# Patient Record
Sex: Female | Born: 1935 | Race: White | Hispanic: No | Smoking: Former smoker
Health system: Southern US, Community
[De-identification: ages and names within clinical notes are randomized; demographics above are authoritative.]

## PROBLEM LIST (undated history)

## (undated) DIAGNOSIS — J449 Chronic obstructive pulmonary disease, unspecified: Secondary | ICD-10-CM

## (undated) DIAGNOSIS — I509 Heart failure, unspecified: Secondary | ICD-10-CM

## (undated) DIAGNOSIS — I10 Essential (primary) hypertension: Secondary | ICD-10-CM

## (undated) HISTORY — PX: APPENDECTOMY: SHX54

## (undated) HISTORY — DX: Essential (primary) hypertension: I10

## (undated) HISTORY — PX: CHOLECYSTECTOMY: SHX55

---

## 1998-10-28 ENCOUNTER — Other Ambulatory Visit: Admission: RE | Admit: 1998-10-28 | Discharge: 1998-10-28 | Payer: Self-pay | Admitting: *Deleted

## 2015-06-02 ENCOUNTER — Institutional Professional Consult (permissible substitution): Payer: Self-pay | Admitting: Internal Medicine

## 2015-06-10 ENCOUNTER — Encounter: Payer: Self-pay | Admitting: Internal Medicine

## 2015-06-10 ENCOUNTER — Ambulatory Visit (INDEPENDENT_AMBULATORY_CARE_PROVIDER_SITE_OTHER): Payer: Medicare Other | Admitting: Internal Medicine

## 2015-06-10 VITALS — BP 118/80 | HR 91 | Ht 63.0 in | Wt 150.0 lb

## 2015-06-10 DIAGNOSIS — R911 Solitary pulmonary nodule: Secondary | ICD-10-CM

## 2015-06-10 DIAGNOSIS — R918 Other nonspecific abnormal finding of lung field: Secondary | ICD-10-CM

## 2015-06-10 DIAGNOSIS — R05 Cough: Secondary | ICD-10-CM

## 2015-06-10 DIAGNOSIS — R058 Other specified cough: Secondary | ICD-10-CM

## 2015-06-10 DIAGNOSIS — I38 Endocarditis, valve unspecified: Secondary | ICD-10-CM | POA: Diagnosis not present

## 2015-06-10 MED ORDER — LEVOFLOXACIN 500 MG PO TABS: 500.0000 mg | ORAL_TABLET | Freq: Every day | ORAL | Status: DC

## 2015-06-10 MED ORDER — OLMESARTAN MEDOXOMIL-HCTZ 40-25 MG PO TABS: 1.0000 | ORAL_TABLET | Freq: Every day | ORAL | Status: DC

## 2015-06-10 NOTE — Progress Notes (Signed)
Subjective:    Patient ID: Megan Hurst, female    DOB: 1930/01/01,   MRN: [id number]  HPI  31 yowf with onset around 2000 of doe but never needed resp rx and then quit smoking 2003 s change chronic do  then much worse breathing summer 2015 and started taking proair only helped a little then  much worse since "bad bronchitis" Oct 2015 and even further downhill since but particularly worse since early June 2016 so referred to pulmonary clinic 06/10/2015 by Dr Charlett Nose with new changes on plain cxr in LUL  Vs previous was basically nl in 04/2015 except for vague nodularity in R apex     06/10/2015 1st [facility] office visit/ Sally Menard  - on chronic ACEi with documented MR/AR on Echo 04/30/15   Chief Complaint  Patient presents with  . Pulmonary Consult    Referred by Dr. Modesto Charon. Pt c/o SOB and cough for the past year- worse x 6 wks. She gets SOB walking from room to room at home. Her cough is prod with clear to yellow sputum.    since breathing / cough abruptly worse in mid June 2016  rx'd with one course pred and 5 days of levaquin, seemed to help some then much worse when quit so now back on prednisone alone but mucus still yellow, never bloody/  some mild chills/ no documented fever. Sob x room to room noted no  Better with saba now   No obvious day to day or daytime variability or assoc   cp or chest tightness, subjective wheeze or overt sinus or hb symptoms. No unusual exp hx or h/o childhood pna/ asthma or knowledge of premature birth.  Sleeping ok on 2 pillows without nocturnal  or early am exacerbation  of respiratory  c/o's or need for noct saba. Also denies any obvious fluctuation of symptoms with weather or environmental changes or other aggravating or alleviating factors except as outlined above   Current Medications, Allergies, Complete Past Medical History, Past Surgical History, Family History, and Social History were reviewed in [employer] record.        Review of Systems  Constitutional: Negative for fever, chills and unexpected weight change.  HENT: Negative for congestion, dental problem, ear pain, nosebleeds, postnasal drip, rhinorrhea, sinus pressure, sneezing, sore throat, trouble swallowing and voice change.   Eyes: Negative for visual disturbance.  Respiratory: Positive for cough and shortness of breath. Negative for choking.   Cardiovascular: Positive for leg swelling. Negative for chest pain.  Gastrointestinal: Negative for vomiting, abdominal pain and diarrhea.  Genitourinary: Negative for difficulty urinating.  Musculoskeletal: Positive for arthralgias.  Skin: Negative for rash.  Neurological: Negative for tremors, syncope and headaches.  Hematological: Does not bruise/bleed easily.       Objective:   Physical Exam  amb wf with severe classic voice fatigue and pseudowheeze  Wt Readings from Last 3 Encounters:  06/10/15 150 lb (68.04 kg)    Vital signs reviewed  HEENT: nl dentition, turbinates, and orophanx. Nl external ear canals without cough reflex   NECK :  without JVD/Nodes/TM/ nl carotid upstrokes bilaterally   LUNGS: no acc muscle use, clear to A and P bilaterally without cough on insp or exp maneuvers - pseudowheeze only    CV:  RRR  no s3  II/V1 hsm - no  increase in P2, 1+ pitting bilateral sym lower ext  edema   ABD:  soft and nontender with nl excursion in the  supine position. No bruits or organomegaly, bowel sounds nl  MS:  warm without deformities, calf tenderness, cyanosis or clubbing  SKIN: warm and dry without lesions    NEURO:  alert, approp, no deficits    Baseline cxr 05/03/15 shows only faint RUL density peripherally  CT chest 06/07/15 no change in RUL density/ worse LUL density vs 05/23/15       Assessment & Plan:

## 2015-06-10 NOTE — Assessment & Plan Note (Signed)
See echo 04/30/15 >  AR/ MR/ RVSP 46 with nl EF   ACE inhibitors are problematic in  pts with airway complaints because  even experienced pulmonologists can't always distinguish ace effects from copd/asthma.  By themselves they don't actually cause a problem, much like oxygen can't by itself start a fire, but they certainly serve as a powerful catalyst or enhancer for any "fire"  or inflammatory process in the upper airway, be it caused by an ET  tube or more commonly reflux (especially in the obese or pts with known GERD or who are on biphoshonates).    In the era of ARB near equivalency until we have a better handle on the reversibility of the airway problem, it just makes sense to avoid ACEI  entirely in the short run and then decide later, having established a level of airway control using a reasonable limited regimen, whether to add back ace but even then being very careful to observe the pt for worsening airway control and number of meds used/ needed to control symptoms.    Try benicar 40-25 mg one daily if bp will allow and off lisinopril

## 2015-06-10 NOTE — Patient Instructions (Addendum)
Stop lisinopril  Benicar 40 /25 one daily - ok to break in half if needed   Levaquin 500 mg daily x 7 days   Please schedule a follow up office visit in 2 weeks, sooner if needed with cxr on return

## 2015-06-11 ENCOUNTER — Encounter: Payer: Self-pay | Admitting: Internal Medicine

## 2015-06-11 DIAGNOSIS — R05 Cough: Secondary | ICD-10-CM | POA: Insufficient documentation

## 2015-06-11 DIAGNOSIS — R058 Other specified cough: Secondary | ICD-10-CM | POA: Insufficient documentation

## 2015-06-11 DIAGNOSIS — R911 Solitary pulmonary nodule: Secondary | ICD-10-CM | POA: Insufficient documentation

## 2015-06-11 DIAGNOSIS — R918 Other nonspecific abnormal finding of lung field: Secondary | ICD-10-CM | POA: Insufficient documentation

## 2015-06-11 NOTE — Assessment & Plan Note (Signed)
Although radiologist is concerned with tumor this would be unheard of to evolve over several weeks and strongly points to partially treated pna or BOOP like inflammatory etiology and can be seen on plain cxr so no need for serial CT's at this point   rec repeat levaquin this time x 7days and finish pred then regroup

## 2015-06-11 NOTE — Assessment & Plan Note (Signed)
Clearly seen on plain cxr 05/03/15  - CT chest  05/23/15  23x 15 mm RUL nodule  Will need f/u once sort out symptoms and LUL process which appears much more acute.

## 2015-06-11 NOTE — Assessment & Plan Note (Addendum)
The most common causes of chronic cough in immunocompetent adults include the following: upper airway cough syndrome (UACS), previously referred to as postnasal drip syndrome (PNDS), which is caused by variety of rhinosinus conditions; (2) asthma; (3) GERD; (4) chronic bronchitis from cigarette smoking or other inhaled environmental irritants; (5) nonasthmatic eosinophilic bronchitis; and (6) bronchiectasis.   These conditions, singly or in combination, have accounted for up to 94% of the causes of chronic cough in prospective studies.   Other conditions have constituted no >6% of the causes in prospective studies These have included bronchogenic carcinoma, chronic interstitial pneumonia, sarcoidosis, left ventricular failure, ACEI-induced cough, and aspiration from a condition associated with pharyngeal dysfunction.    Chronic cough is often simultaneously caused by more than one condition. A single cause has been found from 38 to 82% of the time, multiple causes from 18 to 62%. Multiply caused cough has been the result of three diseases up to 42% of the time.       Based on hx and exam, this is most likely:  Classic Upper airway cough syndrome, so named because it's frequently impossible to sort out how much is  CR/sinusitis with freq throat clearing (which can be related to primary GERD)   vs  causing  secondary (" extra esophageal")  GERD from wide swings in gastric pressure that occur with throat clearing, often  promoting self use of mint and menthol lozenges that reduce the lower esophageal sphincter tone and exacerbate the problem further in a cyclical fashion.   These are the same pts (now being labeled as having "irritable larynx syndrome" by some cough centers) who not infrequently have a history of having failed to tolerate ace inhibitors,  dry powder inhalers or biphosphonates or report having atypical reflux symptoms that don't respond to standard doses of PPI , and are easily confused as  having aecopd or asthma flares by even experienced allergists/ pulmonologists.   The first step is to maximize acid suppression and eliminate acei  then regroup in 2 weeks  I had an extended discussion with the patient reviewing all relevant studies completed to date and  lasting  35 m  Each maintenance medication was reviewed in detail including most importantly the difference between maintenance and prns and under what circumstances the prns are to be triggered using an action plan format that is not reflected in the computer generated alphabetically organized AVS.    Please see instructions for details which were reviewed in writing and the patient given a copy highlighting the part that I personally wrote and discussed at today's ov.   See instructions for specific recommendations which were reviewed directly with the patient who was given a copy with highlighter outlining the key components.

## 2015-06-25 ENCOUNTER — Ambulatory Visit (INDEPENDENT_AMBULATORY_CARE_PROVIDER_SITE_OTHER)
Admission: RE | Admit: 2015-06-25 | Discharge: 2015-06-25 | Disposition: A | Discharge status: Home / Self Care | Payer: Medicare Other | Source: Ambulatory Visit | Attending: Internal Medicine | Admitting: Internal Medicine

## 2015-06-25 ENCOUNTER — Telehealth: Payer: Self-pay | Admitting: Internal Medicine

## 2015-06-25 ENCOUNTER — Encounter: Payer: Self-pay | Admitting: Internal Medicine

## 2015-06-25 ENCOUNTER — Ambulatory Visit (INDEPENDENT_AMBULATORY_CARE_PROVIDER_SITE_OTHER): Payer: Medicare Other | Admitting: Internal Medicine

## 2015-06-25 VITALS — BP 132/72 | HR 89 | Wt 152.0 lb

## 2015-06-25 DIAGNOSIS — I38 Endocarditis, valve unspecified: Secondary | ICD-10-CM

## 2015-06-25 DIAGNOSIS — R05 Cough: Secondary | ICD-10-CM

## 2015-06-25 DIAGNOSIS — R058 Other specified cough: Secondary | ICD-10-CM

## 2015-06-25 DIAGNOSIS — I10 Essential (primary) hypertension: Secondary | ICD-10-CM

## 2015-06-25 DIAGNOSIS — R918 Other nonspecific abnormal finding of lung field: Secondary | ICD-10-CM

## 2015-06-25 DIAGNOSIS — R911 Solitary pulmonary nodule: Secondary | ICD-10-CM | POA: Diagnosis not present

## 2015-06-25 NOTE — Telephone Encounter (Signed)
Spoke with pt, requesting cxr results from earlier today. MW please advise on results.  Thanks!

## 2015-06-25 NOTE — Telephone Encounter (Signed)
No acute changes but will need f/u when returns for pfts as planned

## 2015-06-25 NOTE — Progress Notes (Signed)
Subjective:    Patient ID: Megan Hurst, female    DOB: [birth year],   MRN: [id number]    Brief patient profile:  42 yowf with onset around 2000 of doe but never needed resp rx and then quit smoking 2003 s change chronic do  then much worse breathing summer 2015 and started taking proair only helped a little then  much worse since "bad bronchitis" Oct 2015 and even further downhill since but particularly worse since early June 2016 so referred to pulmonary clinic 06/10/2015 by Dr Charlett Nose with new changes on plain cxr in LUL  Vs previous was basically nl in 04/2015 except for vague nodularity in R apex     History of Present Illness  06/10/2015 1st [facility] office visit/ Kamaya Keckler  - on chronic ACEi with documented MR/AR on Echo 04/30/15   Chief Complaint  Patient presents with  . Pulmonary Consult    Referred by Dr. Modesto Charon. Pt c/o SOB and cough for the past year- worse x 6 wks. She gets SOB walking from room to room at home. Her cough is prod with clear to yellow sputum.    since breathing / cough abruptly worse in mid June 2016  rx'd with one course pred and 5 days of levaquin, seemed to help some then much worse when quit so now back on prednisone alone but mucus still yellow, never bloody/  some mild chills/ no documented fever. Sob x room to room noted no  Better with saba now rec Stop lisinopril Benicar 40 /25 one daily - ok to break in half if needed  Levaquin 500 mg daily x 7 days      06/25/2015 f/u ov/Nikole Swartzentruber re: uacs/ unexplained doe/ LUL nodule and RUL nodule Chief Complaint  Patient presents with  . Pulmonary Infiltrates    Breathing is worse since last OV. Reports increased SOB, coughing with production of clear mucus. Denies chest tightness or wheezing.  no better so far f-ff acei desptie pred/ duoneb.levquin/ still on coreg  No obvious day to day or daytime variability or assoc cp or chest tightness, subjective wheeze or overt sinus or hb symptoms. No unusual exp hx  or h/o childhood pna/ asthma or knowledge of premature birth.  Sleeping ok without nocturnal  or early am exacerbation  of respiratory  c/o's or need for noct saba. Also denies any obvious fluctuation of symptoms with weather or environmental changes or other aggravating or alleviating factors except as outlined above   Current Medications, Allergies, Complete Past Medical History, Past Surgical History, Family History, and Social History were reviewed in [employer] record.  ROS  The following are not active complaints unless bolded sore throat, dysphagia, dental problems, itching, sneezing,  nasal congestion or excess/ purulent secretions, ear ache,   fever, chills, sweats, unintended wt loss, classically pleuritic or exertional cp, hemoptysis,  orthopnea pnd or leg swelling, presyncope, palpitations, abdominal pain, anorexia, nausea, vomiting, diarrhea  or change in bowel or bladder habits, change in stools or urine, dysuria,hematuria,  rash, arthralgias, visual complaints, headache, numbness, weakness or ataxia or problems with walking or coordination,  change in mood/affect or memory.             Objective:   Physical Exam  amb wf with severe classic voice fatigue and pseudowheeze  Wt Readings from Last 3 Encounters:  06/25/15 152 lb (68.947 kg)  06/10/15 150 lb (68.04 kg)    Vital signs reviewed  HEENT: nl dentition, turbinates, and orophanx. Nl external ear canals without cough reflex   NECK :  without JVD/Nodes/TM/ nl carotid upstrokes bilaterally   LUNGS: no acc muscle use, clear to A and P bilaterally without cough on insp or exp maneuvers - pseudowheeze only    CV:  RRR  no s3  II/V1 hsm - no  increase in P2, trace pitting bilateral sym lower ext  edema   ABD:  soft and nontender with nl excursion in the supine position. No bruits or organomegaly, bowel sounds nl  MS:  warm without deformities, calf tenderness, cyanosis or clubbing  SKIN:  warm and dry without lesions    NEURO:  alert, approp, no deficits    Baseline cxr 04/15/15 shows only faint RUL density peripherally/ nothing on L at all    CT chest 06/07/15 no change in RUL density/ worse LUL density vs 05/23/15    CXR PA and Lateral:   06/25/2015 :     I personally reviewed images and agree with radiology impression as follows:   Stable small nodular density is noted in right upper lobe. Irregular density seen laterally in left upper lobe appears to be more prominent and solid in appearance currently. This may simply represent focal inflammation, but neoplasm or malignancy cannot be excluded. Repeat CT scan should be considered.       Assessment & Plan:

## 2015-06-25 NOTE — Patient Instructions (Addendum)
Please remember to go to the   x-ray department downstairs for your tests - we will call you with the results when they are available.   Please schedule a follow up office visit in 4 weeks, sooner if needed    If airflow obst on pfts consider change coreg to bisoprolol

## 2015-06-26 NOTE — Telephone Encounter (Signed)
Called and spoke to pt. Informed her of the results and recs per MW. Appt made for PFT on 9.7.16. Pt verbalized understanding and denied any further questions or concerns at this time.

## 2015-06-27 ENCOUNTER — Encounter: Payer: Self-pay | Admitting: Internal Medicine

## 2015-06-27 DIAGNOSIS — I10 Essential (primary) hypertension: Secondary | ICD-10-CM | POA: Insufficient documentation

## 2015-06-27 NOTE — Assessment & Plan Note (Signed)
See echo 04/30/15 >  AR/ MR/ RVSP 46 with nl EF  - trial off acei due to pseudowheeze 06/10/2015 > changed to benicar 40-25   Too soon to tell much change though bp up and note concern with copd/ab so reluctant to increase coreg at this point and in fact Strongly prefer in this setting: Bystolic, the most beta -1  selective Beta blocker available in sample form, with bisoprolol the most selective generic choice  on the market.   For now no change rx but follow up in 4 weeks/ advised avoid salt

## 2015-06-27 NOTE — Assessment & Plan Note (Addendum)
-   Trial off acei 06/11/2015 >>>  - 06/25/2015   Walked RA x one lap @ 185 stopped due to  Sob/ fatigue/ slow pace no desat   Way too soon to see benefit from chronic acei w/d > upper airway wheeze only on exam so no change rx > f/u in 4 weeks with pfts on return to sort out uacs from copd/ab

## 2015-06-27 NOTE — Assessment & Plan Note (Signed)
cxr series again reviewed and if labeled correctly are inconsistent with a "tumor" plainly viz on cxr now but nothing at all on 04/15/15   Most c/w inflammatory/ post pna process > f/u cxr in 4 weeks  I had an extended discussion with the patient reviewing all relevant studies completed to date and  lasting 15 to 20 minutes of a 25 minute visit    Discussed in detail all the  indications, usual  risks and alternatives  relative to the benefits with patient who agrees to proceed with  Conservative rx as outlined  Each maintenance medication was reviewed in detail including most importantly the difference between maintenance and prns and under what circumstances the prns are to be triggered using an action plan format that is not reflected in the computer generated alphabetically organized AVS.    Please see instructions for details which were reviewed in writing and the patient given a copy highlighting the part that I personally wrote and discussed at today's ov.

## 2015-06-27 NOTE — Assessment & Plan Note (Signed)
Noted/ not ideal/ will f/u in 4 weeks

## 2015-07-21 ENCOUNTER — Other Ambulatory Visit: Payer: Self-pay | Admitting: Internal Medicine

## 2015-07-21 DIAGNOSIS — R0600 Dyspnea, unspecified: Secondary | ICD-10-CM

## 2015-07-22 ENCOUNTER — Ambulatory Visit (INDEPENDENT_AMBULATORY_CARE_PROVIDER_SITE_OTHER): Payer: Medicare Other | Admitting: Internal Medicine

## 2015-07-22 DIAGNOSIS — R0600 Dyspnea, unspecified: Secondary | ICD-10-CM

## 2015-07-22 LAB — PULMONARY FUNCTION TEST
DL/VA % PRED: 43 %
DL/VA: 2.07 ml/min/mmHg/L
DLCO UNC: 5.87 ml/min/mmHg
DLCO unc % pred: 25 %
FEF 25-75 Post: 0.41 L/sec
FEF 25-75 Pre: 0.5 L/sec
FEF2575-%Change-Post: -18 %
FEF2575-%PRED-PRE: 41 %
FEF2575-%Pred-Post: 33 %
FEV1-%Change-Post: -3 %
FEV1-%PRED-POST: 52 %
FEV1-%PRED-PRE: 54 %
FEV1-Post: 0.92 L
FEV1-Pre: 0.96 L
FEV1FVC-%Change-Post: 1 %
FEV1FVC-%Pred-Pre: 89 %
FEV6-%Change-Post: -5 %
FEV6-%PRED-PRE: 65 %
FEV6-%Pred-Post: 62 %
FEV6-POST: 1.39 L
FEV6-PRE: 1.47 L
FEV6FVC-%PRED-POST: 106 %
FEV6FVC-%Pred-Pre: 106 %
FVC-%Change-Post: -5 %
FVC-%Pred-Post: 58 %
FVC-%Pred-Pre: 61 %
FVC-POST: 1.39 L
FVC-Pre: 1.47 L
PRE FEV1/FVC RATIO: 65 %
Post FEV1/FVC ratio: 66 %
Post FEV6/FVC ratio: 100 %
Pre FEV6/FVC Ratio: 100 %
RV % pred: 99 %
RV: 2.4 L
TLC % PRED: 84 %
TLC: 4.19 L

## 2015-07-22 NOTE — Progress Notes (Signed)
PFT done today. 

## 2015-07-24 ENCOUNTER — Encounter: Payer: Self-pay | Admitting: Internal Medicine

## 2015-07-24 ENCOUNTER — Ambulatory Visit (INDEPENDENT_AMBULATORY_CARE_PROVIDER_SITE_OTHER): Payer: Medicare Other | Admitting: Internal Medicine

## 2015-07-24 VITALS — BP 122/60 | HR 74 | Ht 63.0 in | Wt 153.2 lb

## 2015-07-24 DIAGNOSIS — R918 Other nonspecific abnormal finding of lung field: Secondary | ICD-10-CM | POA: Diagnosis not present

## 2015-07-24 DIAGNOSIS — J449 Chronic obstructive pulmonary disease, unspecified: Secondary | ICD-10-CM

## 2015-07-24 DIAGNOSIS — R0600 Dyspnea, unspecified: Secondary | ICD-10-CM | POA: Diagnosis not present

## 2015-07-24 MED ORDER — NEBIVOLOL HCL 5 MG PO TABS: ORAL_TABLET | ORAL | Status: DC

## 2015-07-24 NOTE — Progress Notes (Signed)
Subjective:    Patient ID: Megan Hurst, female    DOB: 1930/04/24,   MRN: [id number]    Brief patient profile:  57 yowf with onset around 2000 of doe but never needed resp rx and then quit smoking 2003 s change chronic doe  then much worse breathing summer 2015 and started taking proair only helped a little then  much worse since "bad bronchitis" Oct 2015 and even further downhill since but particularly worse since early June 2016 so referred to pulmonary clinic 06/10/2015 by Dr Charlett Nose with new changes on plain cxr in LUL  Vs previous was basically nl in 04/2015 except for vague nodularity in R apex with GOLD II criteria by pfts 07/22/15  And placed noct 02 06/2015     History of Present Illness  06/10/2015 1st [facility] office visit/ Wert  - on chronic ACEi with documented MR/AR on Echo 04/30/15   Chief Complaint  Patient presents with  . Pulmonary Consult    Referred by Dr. Modesto Charon. Pt c/o SOB and cough for the past year- worse x 6 wks. She gets SOB walking from room to room at home. Her cough is prod with clear to yellow sputum.    since breathing / cough abruptly worse in mid June 2016  rx'd with one course pred and 5 days of levaquin, seemed to help some then much worse when quit so now back on prednisone alone but mucus still yellow, never bloody/  some mild chills/ no documented fever. Sob x room to room noted no  Better with saba now rec Stop lisinopril Benicar 40 /25 one daily - ok to break in half if needed  Levaquin 500 mg daily x 7 days      06/25/2015 f/u ov/Wert re: uacs/ unexplained doe/ LUL nodule and RUL nodule Chief Complaint  Patient presents with  . Pulmonary Infiltrates    Breathing is worse since last OV. Reports increased SOB, coughing with production of clear mucus. Denies chest tightness or wheezing.  no better so far off acei desptie pred/ duoneb.levquin/ still on coreg rec Consider d/c coreg if airflow obst    07/24/2015 f/u ov/Wert re:  GOLD II  copd on duoneb up to qid plus coreg 12.5 bid  Chief Complaint  Patient presents with  . Follow-up    4 week f/u after PFT - c/o increased sob and wheezing over past 2 weeks, prod cough (yellow) - Seen at urgent care for leg would - started on doxycycline for 10 days - Had PFT  on 07/22/15   At slow pace can do HT but not [employer]    No obvious day to day or daytime variability or assoc cp or chest tightness, subjective wheeze or overt sinus or hb symptoms. No unusual exp hx or h/o childhood pna/ asthma or knowledge of premature birth.  Sleeping ok without nocturnal  or early am exacerbation  of respiratory  c/o's or need for noct saba. Also denies any obvious fluctuation of symptoms with weather or environmental changes or other aggravating or alleviating factors except as outlined above   Current Medications, Allergies, Complete Past Medical History, Past Surgical History, Family History, and Social History were reviewed in [employer] record.  ROS  The following are not active complaints unless bolded sore throat, dysphagia, dental problems, itching, sneezing,  nasal congestion or excess/ purulent secretions, ear ache,   fever, chills, sweats, unintended wt loss, classically pleuritic or exertional cp, hemoptysis,  orthopnea pnd or leg swelling, presyncope, palpitations, abdominal pain, anorexia, nausea, vomiting, diarrhea  or change in bowel or bladder habits, change in stools or urine, dysuria,hematuria,  rash, arthralgias, visual complaints, headache, numbness, weakness or ataxia or problems with walking or coordination,  change in mood/affect or memory.             Objective:   Physical Exam  amb wf  nad   Wt Readings from Last 3 Encounters:  07/24/15 153 lb 3.2 oz (69.491 kg)  06/25/15 152 lb (68.947 kg)  06/10/15 150 lb (68.04 kg)    Vital signs reviewed    HEENT: nl dentition, turbinates, and orophanx. Nl external ear canals without cough  reflex   NECK :  without JVD/Nodes/TM/ nl carotid upstrokes bilaterally   LUNGS: no acc muscle use,  insp and exp rhonchi bilaterally    CV:  RRR  no s3  II/V1 hsm - no  increase in P2, trace pitting bilateral sym lower ext  edema   ABD:  soft and nontender with nl excursion in the supine position. No bruits or organomegaly, bowel sounds nl  MS:  warm without deformities, calf tenderness, cyanosis or clubbing  SKIN: warm and dry without lesions    NEURO:  alert, approp, no deficits    Baseline cxr 04/15/15 shows only faint RUL density peripherally/ nothing on L at all    CT chest 06/07/15 no change in RUL density/ worse LUL density vs 05/23/15    CXR PA and Lateral:   06/25/2015 :     I personally reviewed images and agree with radiology impression as follows:   Stable small nodular density is noted in right upper lobe. Irregular density seen laterally in left upper lobe appears to be more prominent and solid in appearance currently. This may simply represent focal inflammation, but neoplasm or malignancy cannot be excluded. Repeat CT scan should be considered.       Assessment & Plan:

## 2015-07-24 NOTE — Patient Instructions (Addendum)
Stop coreg and just take bystolic 5 mg twice daily in its place over 2 weeks to see what difference it makes in your ability to walk and need for duoneb.  Please schedule a follow up office visit in 2 weeks, sooner if needed with cxr

## 2015-07-25 DIAGNOSIS — J449 Chronic obstructive pulmonary disease, unspecified: Secondary | ICD-10-CM | POA: Insufficient documentation

## 2015-07-25 NOTE — Assessment & Plan Note (Addendum)
PFT's  07/22/2015  FEV1  0.92 (52 % ) ratio 66  p no % improvement from saba with DLCO  25 % corrects to 43 % for alv volume - 07/24/2015   Walked RA x one lap @ 185 stopped due to  Sob slow pace sats 90%   Symptoms remain difficult to control and note now on doxy for another flare of cough/ wheeze x 2 weeks though airflow obst is only mild DDX of  difficult airways management all start with A and  include Adherence, Ace Inhibitors, Acid Reflux, Active Sinus Disease, Alpha 1 Antitripsin deficiency, Anxiety masquerading as Airways dz,  ABPA,  allergy(esp in young), Aspiration (esp in elderly), Adverse effects of meds,  Active smokers, A bunch of PE's (a small clot burden can't cause this syndrome unless there is already severe underlying pulm or vascular dz with poor reserve) plus two Bs  = Bronchiectasis and Beta blocker use..and one C= CHF   Adherence is always the initial "prime suspect" and is a multilayered concern that requires a "trust but verify" approach in every patient - starting with knowing how to use medications, especially inhalers, correctly, keeping up with refills and understanding the fundamental difference between maintenance and prns vs those medications only taken for a very short course and then stopped and not refilled.   ? Acid (or non-acid) GERD > always difficult to exclude as up to 75% of pts in some series report no assoc GI/ Heartburn symptoms> rec continue max (24h)  acid suppression and diet restrictions/ reviewed     ? BB effects > try bystolic 5 mg bid x 2 weeks   I had an extended discussion with the patient reviewing all relevant studies completed to date and  lasting 15 to 20 minutes of a 25 minute visit    Each maintenance medication was reviewed in detail including most importantly the difference between maintenance and prns and under what circumstances the prns are to be triggered using an action plan format that is not reflected in the computer generated  alphabetically organized AVS.    Please see instructions for details which were reviewed in writing and the patient given a copy highlighting the part that I personally wrote and discussed at today's ov.

## 2015-07-25 NOTE — Assessment & Plan Note (Signed)
LUL process is new since 04/15/15 so needs f/u cxr new ov but nothing more for now

## 2015-08-07 ENCOUNTER — Ambulatory Visit (INDEPENDENT_AMBULATORY_CARE_PROVIDER_SITE_OTHER): Payer: Medicare Other | Admitting: Internal Medicine

## 2015-08-07 ENCOUNTER — Ambulatory Visit (INDEPENDENT_AMBULATORY_CARE_PROVIDER_SITE_OTHER)
Admission: RE | Admit: 2015-08-07 | Discharge: 2015-08-07 | Disposition: A | Discharge status: Home / Self Care | Payer: Medicare Other | Source: Ambulatory Visit | Attending: Internal Medicine | Admitting: Internal Medicine

## 2015-08-07 ENCOUNTER — Encounter: Payer: Self-pay | Admitting: Internal Medicine

## 2015-08-07 VITALS — BP 142/64 | HR 114 | Ht 63.0 in | Wt 147.8 lb

## 2015-08-07 DIAGNOSIS — R918 Other nonspecific abnormal finding of lung field: Secondary | ICD-10-CM

## 2015-08-07 DIAGNOSIS — R911 Solitary pulmonary nodule: Secondary | ICD-10-CM | POA: Diagnosis not present

## 2015-08-07 DIAGNOSIS — R05 Cough: Secondary | ICD-10-CM

## 2015-08-07 DIAGNOSIS — J449 Chronic obstructive pulmonary disease, unspecified: Secondary | ICD-10-CM | POA: Diagnosis not present

## 2015-08-07 DIAGNOSIS — I10 Essential (primary) hypertension: Secondary | ICD-10-CM

## 2015-08-07 DIAGNOSIS — R058 Other specified cough: Secondary | ICD-10-CM

## 2015-08-07 MED ORDER — IPRATROPIUM-ALBUTEROL 0.5-2.5 (3) MG/3ML IN SOLN: 3.0000 mL | Freq: Four times a day (QID) | RESPIRATORY_TRACT | Status: DC

## 2015-08-07 MED ORDER — IPRATROPIUM-ALBUTEROL 0.5-2.5 (3) MG/3ML IN SOLN: 3.0000 mL | Freq: Four times a day (QID) | RESPIRATORY_TRACT | Status: AC

## 2015-08-07 NOTE — Progress Notes (Signed)
Subjective:    Patient ID: Megan Hurst, female    DOB: 03/13/1930,   MRN: [id number]    Brief patient profile:  30 yowf with onset around 2000 of doe but never needed resp rx and then quit smoking 2003 s change chronic doe  then much worse breathing summer 2015 and started taking proair only helped a little then  much worse since "bad bronchitis" Oct 2015 and even further downhill since but particularly worse since early June 2016 so referred to pulmonary clinic 06/10/2015 by Dr Charlett Nose with new changes on plain cxr in LUL  Vs previous was basically nl in 04/2015 except for vague nodularity in R apex with GOLD II criteria by pfts 07/22/15  And placed noct 02 06/2015     History of Present Illness  06/10/2015 1st [facility] office visit/ Wert  - on chronic ACEi with documented MR/AR on Echo 04/30/15   Chief Complaint  Patient presents with  . Pulmonary Consult    Referred by Dr. Modesto Charon. Pt c/o SOB and cough for the past year- worse x 6 wks. She gets SOB walking from room to room at home. Her cough is prod with clear to yellow sputum.    since breathing / cough abruptly worse in mid June 2016  rx'd with one course pred and 5 days of levaquin, seemed to help some then much worse when quit so now back on prednisone alone but mucus still yellow, never bloody/  some mild chills/ no documented fever. Sob x room to room noted no  Better with saba now rec Stop lisinopril Benicar 40 /25 one daily - ok to break in half if needed  Levaquin 500 mg daily x 7 days      06/25/2015 f/u ov/Wert re: uacs/ unexplained doe/ LUL nodule and RUL nodule Chief Complaint  Patient presents with  . Pulmonary Infiltrates    Breathing is worse since last OV. Reports increased SOB, coughing with production of clear mucus. Denies chest tightness or wheezing.  no better so far off acei desptie pred/ duoneb.levquin/ still on coreg rec Consider d/c coreg if airflow obst    07/24/2015 f/u ov/Wert re:  GOLD II  copd on duoneb up to qid plus coreg 12.5 bid  Chief Complaint  Patient presents with  . Follow-up    4 week f/u after PFT - c/o increased sob and wheezing over past 2 weeks, prod cough (yellow) - Seen at urgent care for leg would - started on doxycycline for 10 days - Had PFT  on 07/22/15  At slow pace can do HT but not [employer]  rec Stop coreg and just take bystolic 5 mg twice daily in its place over 2 weeks to see what difference it makes in your ability to walk and need for duoneb. Please schedule a follow up office visit in 2 weeks, sooner if needed with cxr     08/07/2015 f/u ov/Wert re:  Chief Complaint  Patient presents with  . Follow-up    CXR done today. Pt states her breathing is unchanged since the last visit. No new co's today.   could not take take bystolic even once a day so stopped it  Uses 02 2lpm  Am mucus is clear / occ yellow never bloody  Am of ov > alb by itself    No obvious day to day or daytime variability or assoc cp or chest tightness, subjective wheeze or overt sinus or hb symptoms. No unusual  exp hx or h/o childhood pna/ asthma or knowledge of premature birth.  Sleeping ok without nocturnal  or early am exacerbation  of respiratory  c/o's or need for noct saba. Also denies any obvious fluctuation of symptoms with weather or environmental changes or other aggravating or alleviating factors except as outlined above   Current Medications, Allergies, Complete Past Medical History, Past Surgical History, Family History, and Social History were reviewed in [employer] record.  ROS  The following are not active complaints unless bolded sore throat, dysphagia, dental problems, itching, sneezing,  nasal congestion or excess/ purulent secretions, ear ache,   fever, chills, sweats, unintended wt loss, classically pleuritic or exertional cp, hemoptysis,  orthopnea pnd or leg swelling, presyncope, palpitations, abdominal pain, anorexia, nausea,  vomiting, diarrhea  or change in bowel or bladder habits, change in stools or urine, dysuria,hematuria,  rash, arthralgias, visual complaints, headache, numbness, weakness or ataxia or problems with walking or coordination,  change in mood/affect or memory.             Objective:   Physical Exam  amb wf  nad   08/07/2015        147 Wt Readings from Last 3 Encounters:  07/24/15 153 lb 3.2 oz (69.491 kg)  06/25/15 152 lb (68.947 kg)  06/10/15 150 lb (68.04 kg)    Vital signs reviewed    HEENT:  Top dentures - nl   turbinates, and orophanx. Nl external ear canals without cough reflex   NECK :  without JVD/Nodes/TM/ nl carotid upstrokes bilaterally   LUNGS: no acc muscle use,  Now min insp and exp rhonchi bilaterally no more prom on R vs L    CV:  RRR  no s3  II/V1 hsm - no  increase in P2, trace pitting bilateral sym lower ext  edema   ABD:  soft and nontender with nl excursion in the supine position. No bruits or organomegaly, bowel sounds nl  MS:  warm without deformities, calf tenderness, cyanosis or clubbing  SKIN: warm and dry without lesions    NEURO:  alert, approp, no deficits    Baseline cxr 04/15/15 shows only faint RUL density peripherally/ nothing on L at all    CT chest 06/07/15 no change in RUL density/ worse LUL density vs 05/23/15     CXR PA and Lateral:   08/07/2015 :     I personally reviewed images and agree with radiology impression as follows:   Continued peripheral opacities in both upper lobes, not significantly changed since prior study       Assessment & Plan:

## 2015-08-07 NOTE — Patient Instructions (Addendum)
Try add pepcid 20 mg at bedtime  (over the counter)   GERD (REFLUX)  is an extremely common cause of respiratory symptoms just like yours , many times with no obvious heartburn at all.    It can be treated with medication, but also with lifestyle changes including elevation of the head of your bed (ideally with 6 inch  bed blocks),  Smoking cessation, avoidance of late meals, excessive alcohol, and avoid fatty foods, chocolate, peppermint, colas, red wine, and acidic juices such as orange juice.  NO MINT OR MENTHOL PRODUCTS SO NO COUGH DROPS  USE SUGARLESS CANDY INSTEAD (Jolley ranchers or Stover's or Life Savers) or even ice chips will also do - the key is to swallow to prevent all throat clearing. NO OIL BASED VITAMINS - use powdered substitutes.    Change to duoneb bfast lunch supper and bedtime  Please see patient coordinator before you leave today  to schedule duoneb thru your dme company Part B medicare  Only use your albuterol as a rescue medication to be used if you can't catch your breath by resting or doing a relaxed purse lip breathing pattern.  - The less you use it, the better it will work when you need it. - Ok to use up to every 4 hours if you must but call for immediate appointment if use goes up over your usual need  Please schedule a follow up office visit in 4 weeks, sooner if needed with CT no contrast on return

## 2015-08-09 ENCOUNTER — Encounter: Payer: Self-pay | Admitting: Internal Medicine

## 2015-08-09 NOTE — Assessment & Plan Note (Signed)
PFT's  07/22/2015  FEV1  0.92 (52 % ) ratio 66  p no % improvement from saba with DLCO  25 % corrects to 43 % for alv volume   - 07/24/2015   Walked RA x one lap @ 185 stopped due to  Sob slow pace sats 90%   Adequate control on present rx, reviewed > no change in rx needed  though she is confused between using DuoNeb albuterol and albuterol and didn't realize that they all were albuterol containing and over using and over B2 stim > tachycardia  I had an extended discussion with the patient reviewing all relevant studies completed to date and  lasting 15 to 20 minutes of a 25 minute visit    Each maintenance medication was reviewed in detail including most importantly the difference between maintenance and prns and under what circumstances the prns are to be triggered using an action plan format that is not reflected in the computer generated alphabetically organized AVS.    Please see instructions for details which were reviewed in writing and the patient given a copy highlighting the part that I personally wrote and discussed at today's ov.

## 2015-08-09 NOTE — Assessment & Plan Note (Signed)
Discussed in detail all the  indications, usual  risks and alternatives  relative to the benefits with patient who agrees to proceed with CT chest then consider fob

## 2015-08-09 NOTE — Assessment & Plan Note (Signed)
-   Trial off acei 06/11/2015 >>>  - 06/25/2015   Walked RA x one lap @ 185 stopped due to  Sob/ fatigue/ slow pace no desat  - Sinus CT ordered 08/09/2015   Improved but not resolved off of ACE inhibitor. The next step is a sinus CT scan

## 2015-08-09 NOTE — Assessment & Plan Note (Signed)
Adequate control on present rx, reviewed > no change in rx needed    She is okay off of Bystolic except for the tachycardia which I believe is related to overuse of albuterol which we discussed under separate A/P

## 2015-09-04 ENCOUNTER — Inpatient Hospital Stay: Admission: RE | Admit: 2015-09-04 | Payer: Medicare Other | Source: Ambulatory Visit

## 2015-09-04 ENCOUNTER — Ambulatory Visit: Payer: Medicare Other | Admitting: Internal Medicine

## 2015-10-06 ENCOUNTER — Telehealth: Payer: Self-pay | Admitting: Internal Medicine

## 2015-10-06 NOTE — Telephone Encounter (Signed)
lmtcb x1 for Gracie.

## 2015-10-07 NOTE — Telephone Encounter (Signed)
Called spoke with Gracie from Dr. Terrilee Croak office. She is requesting we fax over pt PFT from September. She reports they have a referral from pt PCP to see them Please advise MW if okay to fax? thanks

## 2015-10-07 NOTE — Telephone Encounter (Signed)
These results have been faxed to Dr. Terrilee Croak office. Nothing further was needed.

## 2015-10-07 NOTE — Telephone Encounter (Signed)
That's fine but let them know she did not complete the studies rec at last ov - send this too

## 2016-03-01 IMAGING — CR DG CHEST 2V
2 series · 2 of 2 positions shown · non-contrast
Comparison: Radiograph May 23, 2015; CT scan June 07, 2015.

CLINICAL DATA: Solitary pulmonary nodule, shortness of breath,
cough.

EXAM:
CHEST  2 VIEW

[view not recorded (1 of 2)]
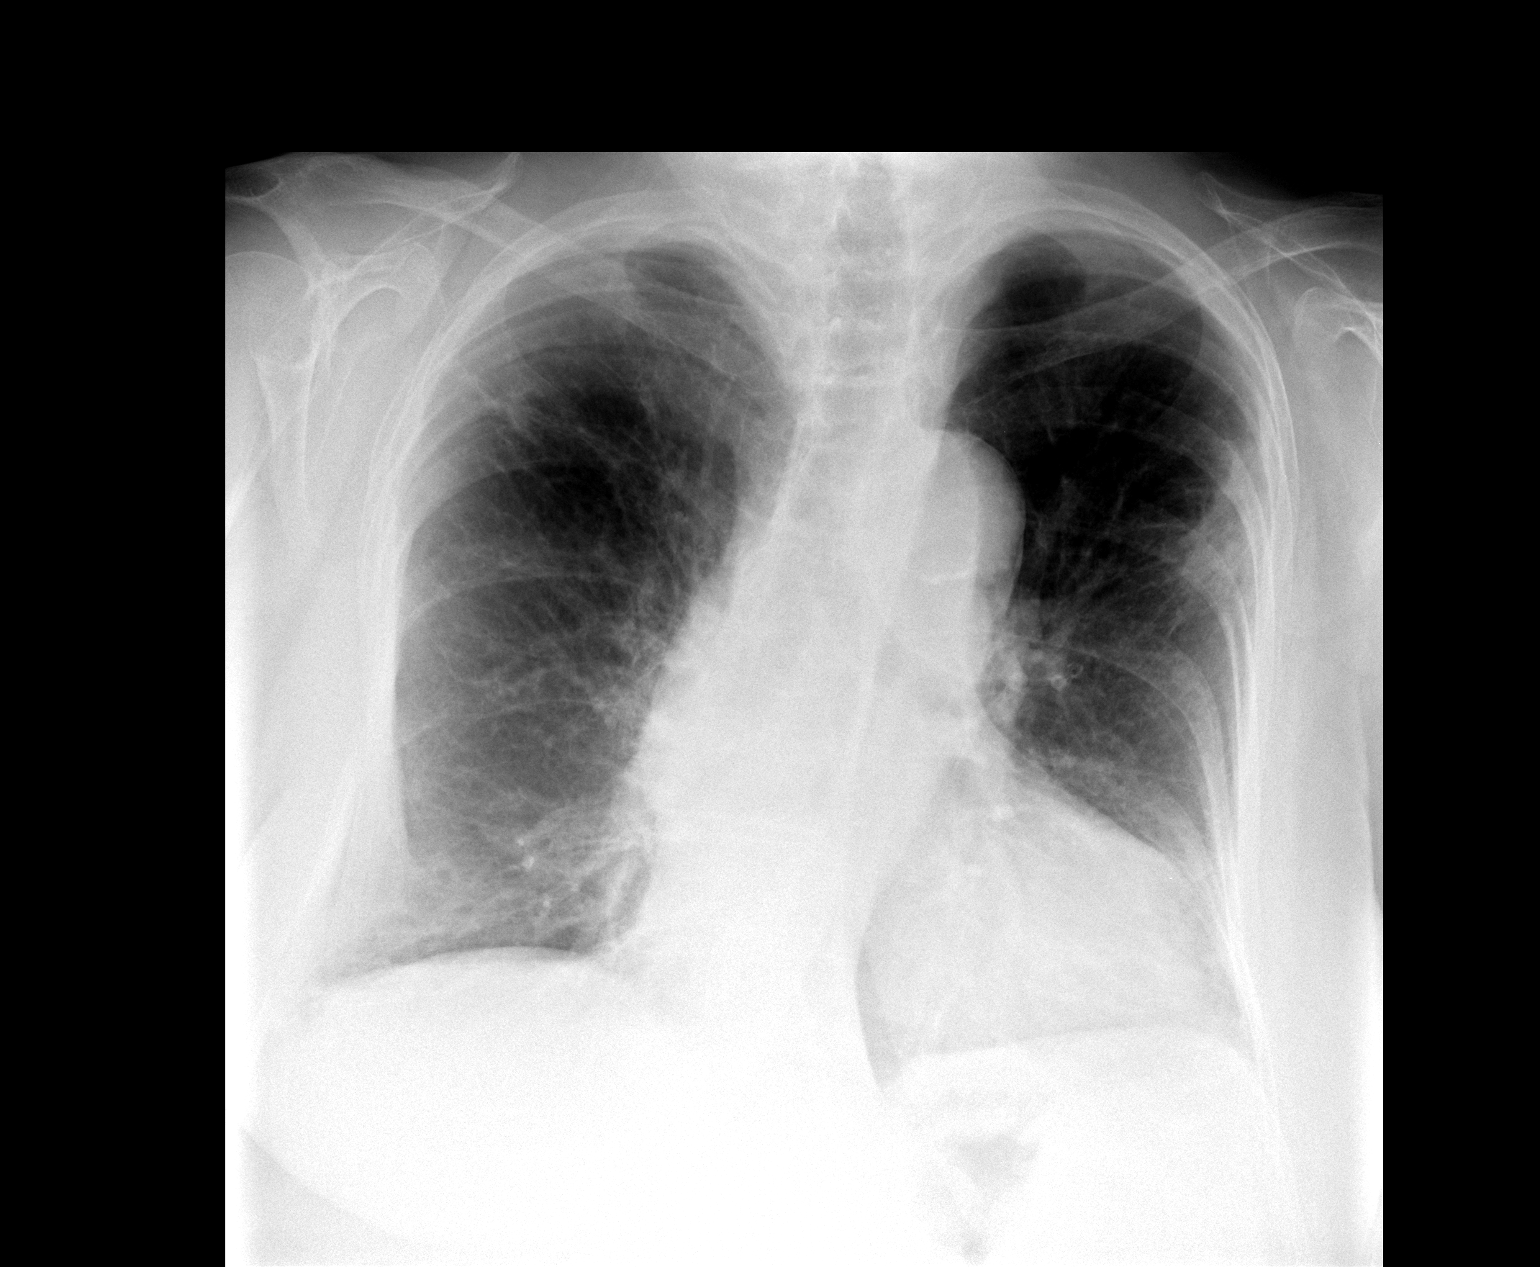

[view not recorded (2 of 2)]
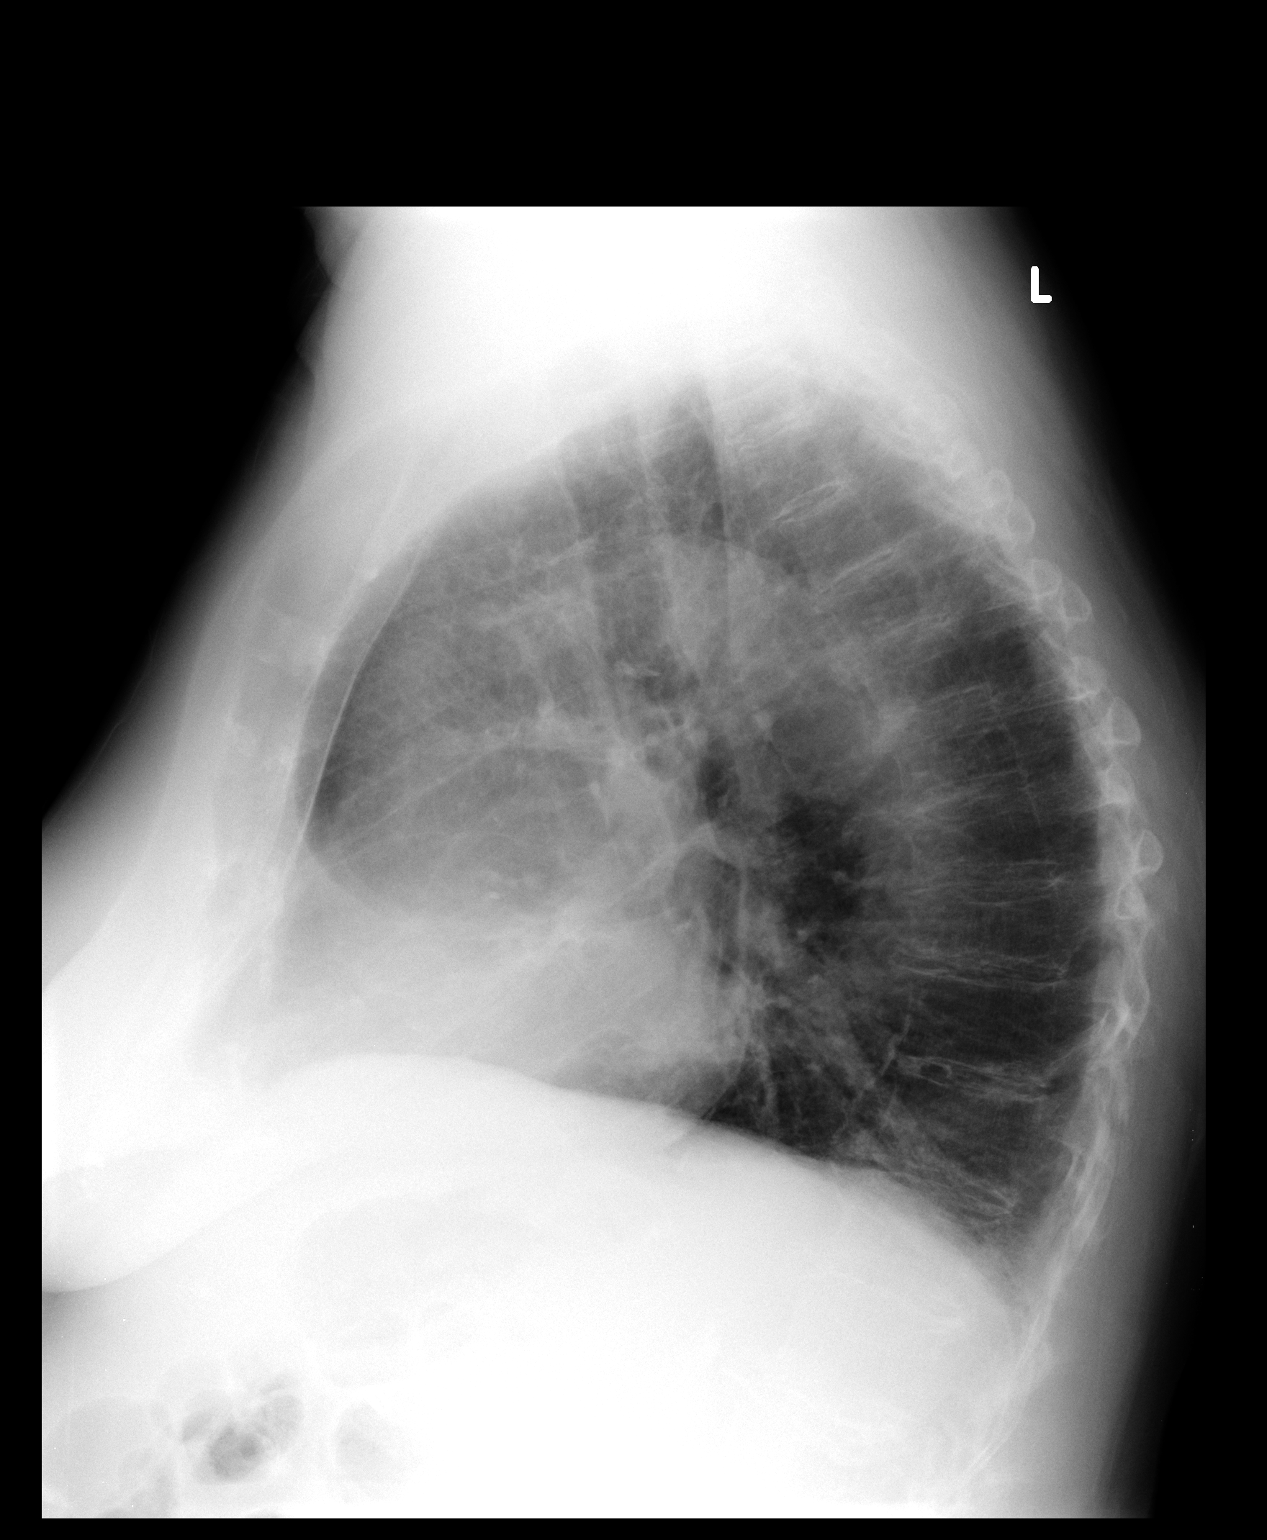

[2 of 2 positions shown; findings below may reference images not displayed]

FINDINGS: Stable cardiomegaly. No pneumothorax or pleural effusion is noted.
Moderate dextroscoliosis of lower thoracic spine is noted. Stable
nodular density is noted in right upper lobe. Irregular density is
noted laterally in the left upper lobe which appears to be more
prominent compared to prior exam.
IMPRESSION: Stable small nodular density is noted in right upper lobe. Irregular
density seen laterally in left upper lobe appears to be more
prominent and solid in appearance currently. This may simply
represent focal inflammation, but neoplasm or malignancy cannot be
excluded. Repeat CT scan should be considered.

## 2016-04-13 IMAGING — CR DG CHEST 2V
2 series · 2 of 2 positions shown · non-contrast
Comparison: 06/25/2015

CLINICAL DATA: Pulmonary infiltrates, follow-up. Hypertension,
prior smoker.

EXAM:
CHEST  2 VIEW

[view not recorded (1 of 2)]
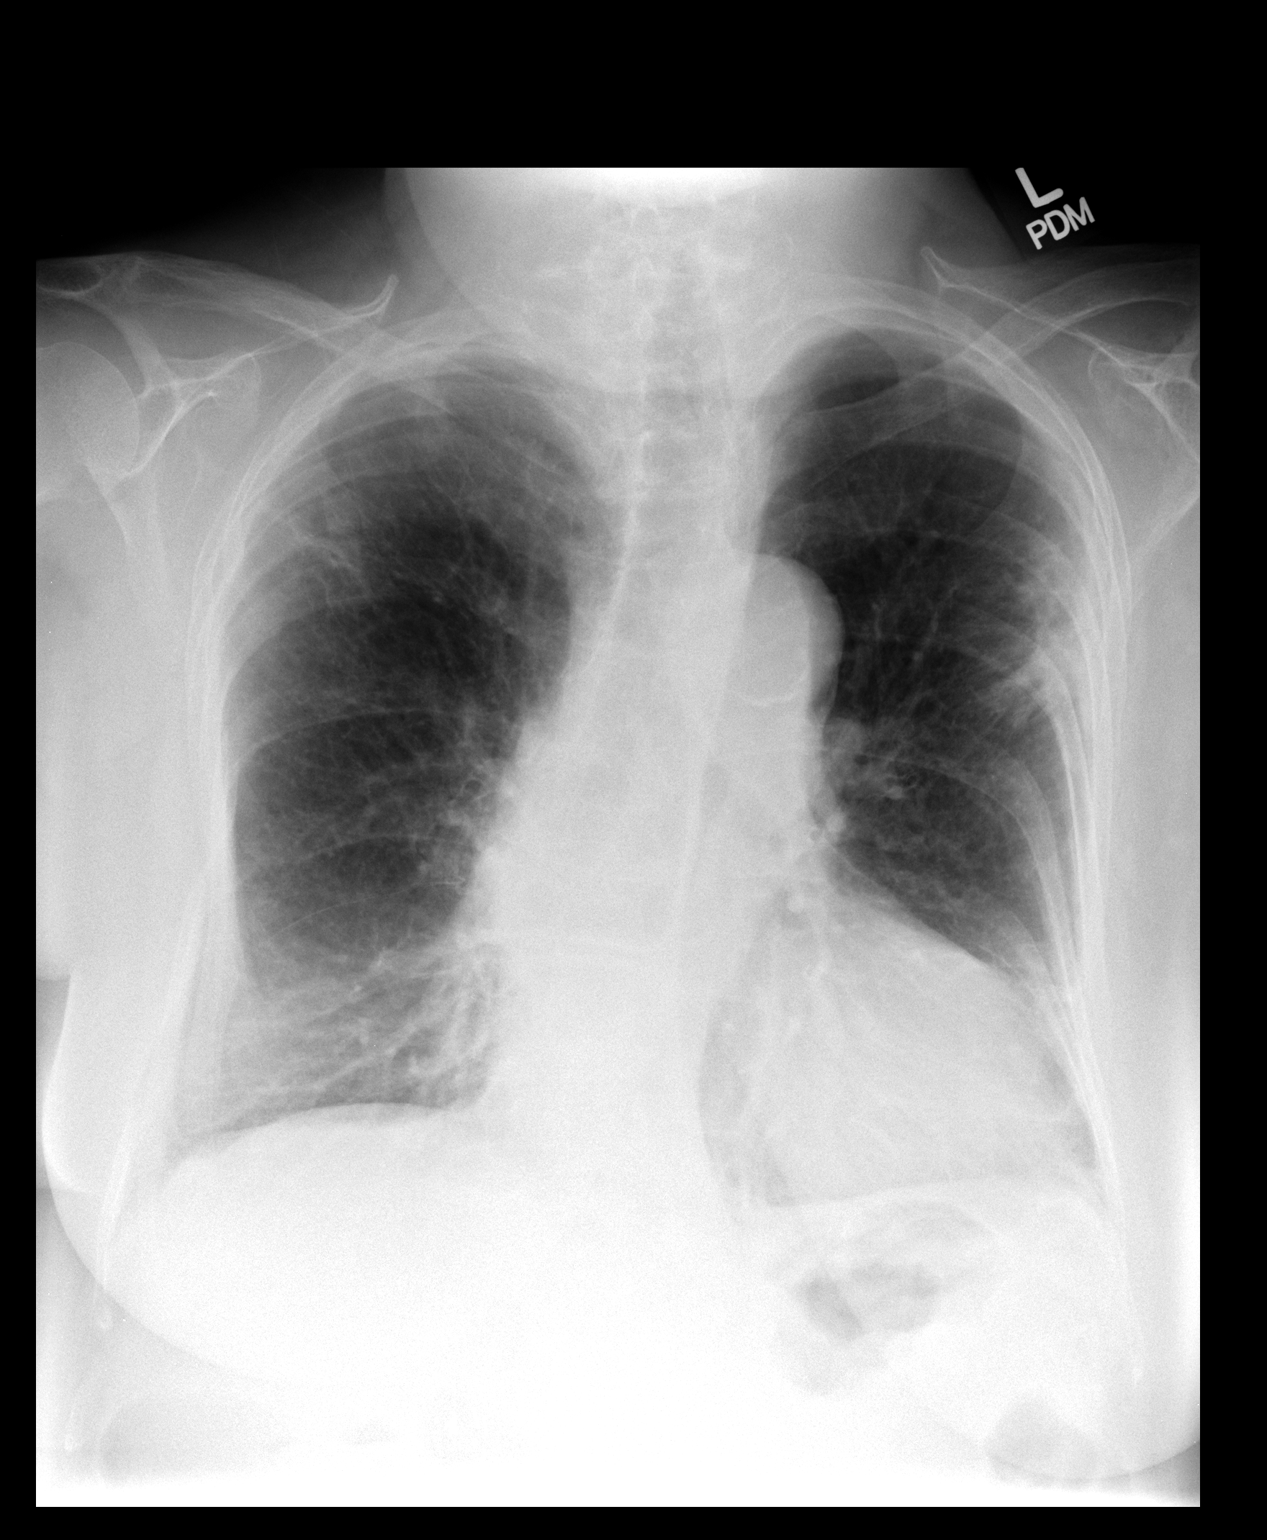

[view not recorded (2 of 2)]
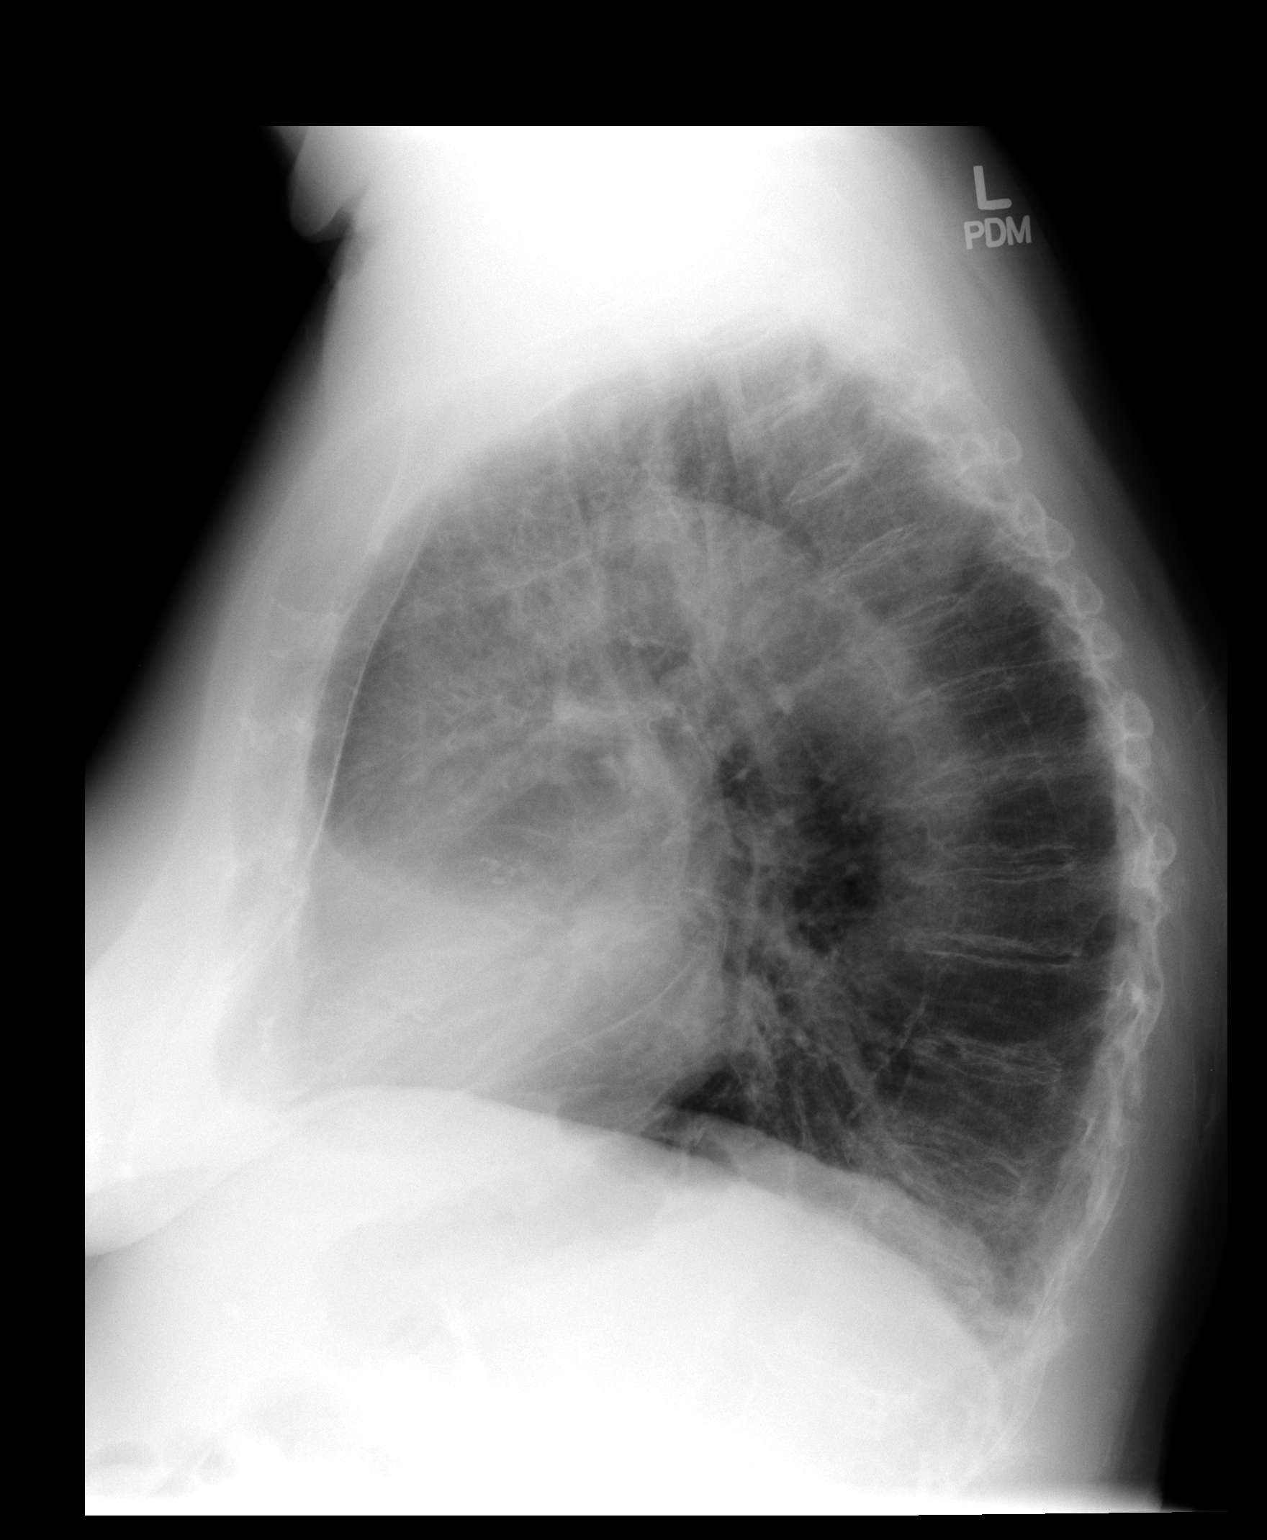

[2 of 2 positions shown; findings below may reference images not displayed]

FINDINGS: Peripheral opacities in both upper lobes all are stable. Heart is
mildly enlarged. Scarring or atelectasis in the lung bases. No
effusions. No acute bony abnormality.
IMPRESSION: Continued peripheral opacities in both upper lobes, not
significantly changed since prior study. Again, malignancy cannot
exclude. Consider repeat chest CT for better characterization.

## 2016-04-16 DIAGNOSIS — I499 Cardiac arrhythmia, unspecified: Secondary | ICD-10-CM | POA: Diagnosis not present

## 2016-04-16 DIAGNOSIS — I509 Heart failure, unspecified: Secondary | ICD-10-CM | POA: Diagnosis not present

## 2016-04-16 DIAGNOSIS — J441 Chronic obstructive pulmonary disease with (acute) exacerbation: Secondary | ICD-10-CM

## 2016-04-16 DIAGNOSIS — R748 Abnormal levels of other serum enzymes: Secondary | ICD-10-CM

## 2016-04-16 DIAGNOSIS — J181 Lobar pneumonia, unspecified organism: Secondary | ICD-10-CM

## 2018-07-12 DIAGNOSIS — R748 Abnormal levels of other serum enzymes: Secondary | ICD-10-CM

## 2018-07-12 DIAGNOSIS — J441 Chronic obstructive pulmonary disease with (acute) exacerbation: Secondary | ICD-10-CM

## 2018-07-12 DIAGNOSIS — J189 Pneumonia, unspecified organism: Secondary | ICD-10-CM | POA: Diagnosis not present

## 2018-07-12 DIAGNOSIS — J9601 Acute respiratory failure with hypoxia: Secondary | ICD-10-CM | POA: Diagnosis not present

## 2018-07-12 DIAGNOSIS — J159 Unspecified bacterial pneumonia: Secondary | ICD-10-CM | POA: Diagnosis not present

## 2018-07-12 DIAGNOSIS — I509 Heart failure, unspecified: Secondary | ICD-10-CM | POA: Diagnosis not present

## 2018-07-13 DIAGNOSIS — J9601 Acute respiratory failure with hypoxia: Secondary | ICD-10-CM | POA: Diagnosis not present

## 2018-07-13 DIAGNOSIS — J159 Unspecified bacterial pneumonia: Secondary | ICD-10-CM | POA: Diagnosis not present

## 2018-07-13 DIAGNOSIS — J441 Chronic obstructive pulmonary disease with (acute) exacerbation: Secondary | ICD-10-CM | POA: Diagnosis not present

## 2018-07-14 DIAGNOSIS — J441 Chronic obstructive pulmonary disease with (acute) exacerbation: Secondary | ICD-10-CM | POA: Diagnosis not present

## 2018-07-14 DIAGNOSIS — J9601 Acute respiratory failure with hypoxia: Secondary | ICD-10-CM | POA: Diagnosis not present

## 2018-07-14 DIAGNOSIS — J159 Unspecified bacterial pneumonia: Secondary | ICD-10-CM | POA: Diagnosis not present

## 2020-03-13 DIAGNOSIS — I10 Essential (primary) hypertension: Secondary | ICD-10-CM

## 2020-03-13 DIAGNOSIS — E871 Hypo-osmolality and hyponatremia: Secondary | ICD-10-CM

## 2020-03-13 DIAGNOSIS — J441 Chronic obstructive pulmonary disease with (acute) exacerbation: Secondary | ICD-10-CM

## 2020-03-13 DIAGNOSIS — R0902 Hypoxemia: Secondary | ICD-10-CM

## 2020-03-13 DIAGNOSIS — I509 Heart failure, unspecified: Secondary | ICD-10-CM

## 2020-03-14 DIAGNOSIS — J441 Chronic obstructive pulmonary disease with (acute) exacerbation: Secondary | ICD-10-CM | POA: Diagnosis not present

## 2020-03-14 DIAGNOSIS — E871 Hypo-osmolality and hyponatremia: Secondary | ICD-10-CM | POA: Diagnosis not present

## 2020-03-14 DIAGNOSIS — R0902 Hypoxemia: Secondary | ICD-10-CM | POA: Diagnosis not present

## 2020-03-14 DIAGNOSIS — I509 Heart failure, unspecified: Secondary | ICD-10-CM | POA: Diagnosis not present

## 2020-03-15 DIAGNOSIS — E871 Hypo-osmolality and hyponatremia: Secondary | ICD-10-CM | POA: Diagnosis not present

## 2020-03-15 DIAGNOSIS — R0902 Hypoxemia: Secondary | ICD-10-CM | POA: Diagnosis not present

## 2020-03-15 DIAGNOSIS — J441 Chronic obstructive pulmonary disease with (acute) exacerbation: Secondary | ICD-10-CM | POA: Diagnosis not present

## 2020-03-15 DIAGNOSIS — I509 Heart failure, unspecified: Secondary | ICD-10-CM | POA: Diagnosis not present

## 2020-03-16 DIAGNOSIS — R0902 Hypoxemia: Secondary | ICD-10-CM | POA: Diagnosis not present

## 2020-03-16 DIAGNOSIS — E871 Hypo-osmolality and hyponatremia: Secondary | ICD-10-CM | POA: Diagnosis not present

## 2020-03-16 DIAGNOSIS — I509 Heart failure, unspecified: Secondary | ICD-10-CM | POA: Diagnosis not present

## 2020-03-16 DIAGNOSIS — J441 Chronic obstructive pulmonary disease with (acute) exacerbation: Secondary | ICD-10-CM | POA: Diagnosis not present

## 2020-03-16 DIAGNOSIS — I4891 Unspecified atrial fibrillation: Secondary | ICD-10-CM

## 2020-03-17 DIAGNOSIS — E871 Hypo-osmolality and hyponatremia: Secondary | ICD-10-CM | POA: Diagnosis not present

## 2020-03-17 DIAGNOSIS — I509 Heart failure, unspecified: Secondary | ICD-10-CM | POA: Diagnosis not present

## 2020-03-17 DIAGNOSIS — I351 Nonrheumatic aortic (valve) insufficiency: Secondary | ICD-10-CM | POA: Diagnosis not present

## 2020-03-17 DIAGNOSIS — R0902 Hypoxemia: Secondary | ICD-10-CM | POA: Diagnosis not present

## 2020-03-17 DIAGNOSIS — I361 Nonrheumatic tricuspid (valve) insufficiency: Secondary | ICD-10-CM | POA: Diagnosis not present

## 2020-03-17 DIAGNOSIS — J441 Chronic obstructive pulmonary disease with (acute) exacerbation: Secondary | ICD-10-CM | POA: Diagnosis not present

## 2020-03-18 DIAGNOSIS — E871 Hypo-osmolality and hyponatremia: Secondary | ICD-10-CM | POA: Diagnosis not present

## 2020-03-18 DIAGNOSIS — R0902 Hypoxemia: Secondary | ICD-10-CM | POA: Diagnosis not present

## 2020-03-18 DIAGNOSIS — I509 Heart failure, unspecified: Secondary | ICD-10-CM | POA: Diagnosis not present

## 2020-03-18 DIAGNOSIS — J441 Chronic obstructive pulmonary disease with (acute) exacerbation: Secondary | ICD-10-CM | POA: Diagnosis not present

## 2020-03-19 DIAGNOSIS — R0902 Hypoxemia: Secondary | ICD-10-CM | POA: Diagnosis not present

## 2020-03-19 DIAGNOSIS — J441 Chronic obstructive pulmonary disease with (acute) exacerbation: Secondary | ICD-10-CM | POA: Diagnosis not present

## 2020-03-19 DIAGNOSIS — I509 Heart failure, unspecified: Secondary | ICD-10-CM | POA: Diagnosis not present

## 2020-03-19 DIAGNOSIS — E871 Hypo-osmolality and hyponatremia: Secondary | ICD-10-CM | POA: Diagnosis not present

## 2020-03-20 DIAGNOSIS — R0902 Hypoxemia: Secondary | ICD-10-CM | POA: Diagnosis not present

## 2020-03-20 DIAGNOSIS — E871 Hypo-osmolality and hyponatremia: Secondary | ICD-10-CM | POA: Diagnosis not present

## 2020-03-20 DIAGNOSIS — J441 Chronic obstructive pulmonary disease with (acute) exacerbation: Secondary | ICD-10-CM | POA: Diagnosis not present

## 2020-03-20 DIAGNOSIS — I509 Heart failure, unspecified: Secondary | ICD-10-CM | POA: Diagnosis not present

## 2020-03-21 DIAGNOSIS — J441 Chronic obstructive pulmonary disease with (acute) exacerbation: Secondary | ICD-10-CM | POA: Diagnosis not present

## 2020-03-21 DIAGNOSIS — R0902 Hypoxemia: Secondary | ICD-10-CM | POA: Diagnosis not present

## 2020-03-21 DIAGNOSIS — I509 Heart failure, unspecified: Secondary | ICD-10-CM | POA: Diagnosis not present

## 2020-03-21 DIAGNOSIS — E871 Hypo-osmolality and hyponatremia: Secondary | ICD-10-CM | POA: Diagnosis not present

## 2020-03-22 DIAGNOSIS — J441 Chronic obstructive pulmonary disease with (acute) exacerbation: Secondary | ICD-10-CM | POA: Diagnosis not present

## 2020-03-22 DIAGNOSIS — E871 Hypo-osmolality and hyponatremia: Secondary | ICD-10-CM | POA: Diagnosis not present

## 2020-03-22 DIAGNOSIS — I509 Heart failure, unspecified: Secondary | ICD-10-CM | POA: Diagnosis not present

## 2020-03-22 DIAGNOSIS — R0902 Hypoxemia: Secondary | ICD-10-CM | POA: Diagnosis not present

## 2020-03-23 DIAGNOSIS — J441 Chronic obstructive pulmonary disease with (acute) exacerbation: Secondary | ICD-10-CM | POA: Diagnosis not present

## 2020-03-23 DIAGNOSIS — I509 Heart failure, unspecified: Secondary | ICD-10-CM | POA: Diagnosis not present

## 2020-03-23 DIAGNOSIS — R0902 Hypoxemia: Secondary | ICD-10-CM | POA: Diagnosis not present

## 2020-03-23 DIAGNOSIS — E871 Hypo-osmolality and hyponatremia: Secondary | ICD-10-CM | POA: Diagnosis not present

## 2020-03-24 DIAGNOSIS — I509 Heart failure, unspecified: Secondary | ICD-10-CM | POA: Diagnosis not present

## 2020-03-24 DIAGNOSIS — R0902 Hypoxemia: Secondary | ICD-10-CM | POA: Diagnosis not present

## 2020-03-24 DIAGNOSIS — J441 Chronic obstructive pulmonary disease with (acute) exacerbation: Secondary | ICD-10-CM | POA: Diagnosis not present

## 2020-03-24 DIAGNOSIS — E871 Hypo-osmolality and hyponatremia: Secondary | ICD-10-CM | POA: Diagnosis not present

## 2020-03-25 DIAGNOSIS — R0902 Hypoxemia: Secondary | ICD-10-CM | POA: Diagnosis not present

## 2020-03-25 DIAGNOSIS — I509 Heart failure, unspecified: Secondary | ICD-10-CM | POA: Diagnosis not present

## 2020-03-25 DIAGNOSIS — E871 Hypo-osmolality and hyponatremia: Secondary | ICD-10-CM | POA: Diagnosis not present

## 2020-03-25 DIAGNOSIS — J441 Chronic obstructive pulmonary disease with (acute) exacerbation: Secondary | ICD-10-CM | POA: Diagnosis not present

## 2020-03-26 DIAGNOSIS — J441 Chronic obstructive pulmonary disease with (acute) exacerbation: Secondary | ICD-10-CM | POA: Diagnosis not present

## 2020-03-26 DIAGNOSIS — E871 Hypo-osmolality and hyponatremia: Secondary | ICD-10-CM | POA: Diagnosis not present

## 2020-03-26 DIAGNOSIS — I509 Heart failure, unspecified: Secondary | ICD-10-CM | POA: Diagnosis not present

## 2020-03-26 DIAGNOSIS — R0902 Hypoxemia: Secondary | ICD-10-CM | POA: Diagnosis not present

## 2020-03-27 DIAGNOSIS — I509 Heart failure, unspecified: Secondary | ICD-10-CM | POA: Diagnosis not present

## 2020-03-27 DIAGNOSIS — J441 Chronic obstructive pulmonary disease with (acute) exacerbation: Secondary | ICD-10-CM | POA: Diagnosis not present

## 2020-03-27 DIAGNOSIS — E871 Hypo-osmolality and hyponatremia: Secondary | ICD-10-CM | POA: Diagnosis not present

## 2020-03-27 DIAGNOSIS — R0902 Hypoxemia: Secondary | ICD-10-CM | POA: Diagnosis not present

## 2020-03-28 DIAGNOSIS — E871 Hypo-osmolality and hyponatremia: Secondary | ICD-10-CM | POA: Diagnosis not present

## 2020-03-28 DIAGNOSIS — J441 Chronic obstructive pulmonary disease with (acute) exacerbation: Secondary | ICD-10-CM | POA: Diagnosis not present

## 2020-03-28 DIAGNOSIS — R0902 Hypoxemia: Secondary | ICD-10-CM | POA: Diagnosis not present

## 2020-03-28 DIAGNOSIS — I509 Heart failure, unspecified: Secondary | ICD-10-CM | POA: Diagnosis not present

## 2020-03-29 DIAGNOSIS — R0902 Hypoxemia: Secondary | ICD-10-CM | POA: Diagnosis not present

## 2020-03-29 DIAGNOSIS — I509 Heart failure, unspecified: Secondary | ICD-10-CM | POA: Diagnosis not present

## 2020-03-29 DIAGNOSIS — J441 Chronic obstructive pulmonary disease with (acute) exacerbation: Secondary | ICD-10-CM | POA: Diagnosis not present

## 2020-03-29 DIAGNOSIS — E871 Hypo-osmolality and hyponatremia: Secondary | ICD-10-CM | POA: Diagnosis not present

## 2020-03-30 DIAGNOSIS — I509 Heart failure, unspecified: Secondary | ICD-10-CM | POA: Diagnosis not present

## 2020-03-30 DIAGNOSIS — J441 Chronic obstructive pulmonary disease with (acute) exacerbation: Secondary | ICD-10-CM | POA: Diagnosis not present

## 2020-03-30 DIAGNOSIS — E871 Hypo-osmolality and hyponatremia: Secondary | ICD-10-CM | POA: Diagnosis not present

## 2020-03-30 DIAGNOSIS — R0902 Hypoxemia: Secondary | ICD-10-CM | POA: Diagnosis not present

## 2020-03-31 ENCOUNTER — Other Ambulatory Visit: Payer: Self-pay

## 2020-03-31 ENCOUNTER — Emergency Department (HOSPITAL_COMMUNITY): Payer: Medicare Other

## 2020-03-31 ENCOUNTER — Encounter (HOSPITAL_COMMUNITY): Payer: Self-pay | Admitting: Emergency Medicine

## 2020-03-31 ENCOUNTER — Encounter (HOSPITAL_COMMUNITY)
Admission: EM | Disposition: A | Discharge status: Hospice | Payer: Self-pay | Source: Home / Self Care | Attending: Internal Medicine

## 2020-03-31 ENCOUNTER — Inpatient Hospital Stay (HOSPITAL_COMMUNITY)
Admission: EM | Admit: 2020-03-31 | Discharge: 2020-04-04 | DRG: 280 | Disposition: A | Discharge status: Hospice | Payer: Medicare Other | Attending: Internal Medicine | Admitting: Internal Medicine

## 2020-03-31 DIAGNOSIS — I10 Essential (primary) hypertension: Secondary | ICD-10-CM | POA: Diagnosis present

## 2020-03-31 DIAGNOSIS — Z880 Allergy status to penicillin: Secondary | ICD-10-CM

## 2020-03-31 DIAGNOSIS — Z885 Allergy status to narcotic agent status: Secondary | ICD-10-CM | POA: Diagnosis not present

## 2020-03-31 DIAGNOSIS — Z9981 Dependence on supplemental oxygen: Secondary | ICD-10-CM | POA: Diagnosis not present

## 2020-03-31 DIAGNOSIS — I2119 ST elevation (STEMI) myocardial infarction involving other coronary artery of inferior wall: Secondary | ICD-10-CM | POA: Diagnosis not present

## 2020-03-31 DIAGNOSIS — Z87891 Personal history of nicotine dependence: Secondary | ICD-10-CM | POA: Diagnosis not present

## 2020-03-31 DIAGNOSIS — Z825 Family history of asthma and other chronic lower respiratory diseases: Secondary | ICD-10-CM | POA: Diagnosis not present

## 2020-03-31 DIAGNOSIS — I4891 Unspecified atrial fibrillation: Secondary | ICD-10-CM | POA: Diagnosis present

## 2020-03-31 DIAGNOSIS — Z8249 Family history of ischemic heart disease and other diseases of the circulatory system: Secondary | ICD-10-CM

## 2020-03-31 DIAGNOSIS — Z7901 Long term (current) use of anticoagulants: Secondary | ICD-10-CM

## 2020-03-31 DIAGNOSIS — Z66 Do not resuscitate: Secondary | ICD-10-CM | POA: Diagnosis present

## 2020-03-31 DIAGNOSIS — J9621 Acute and chronic respiratory failure with hypoxia: Secondary | ICD-10-CM | POA: Diagnosis present

## 2020-03-31 DIAGNOSIS — I259 Chronic ischemic heart disease, unspecified: Secondary | ICD-10-CM | POA: Diagnosis present

## 2020-03-31 DIAGNOSIS — I252 Old myocardial infarction: Secondary | ICD-10-CM | POA: Diagnosis not present

## 2020-03-31 DIAGNOSIS — Z515 Encounter for palliative care: Secondary | ICD-10-CM | POA: Diagnosis present

## 2020-03-31 DIAGNOSIS — I110 Hypertensive heart disease with heart failure: Secondary | ICD-10-CM | POA: Diagnosis present

## 2020-03-31 DIAGNOSIS — N179 Acute kidney failure, unspecified: Secondary | ICD-10-CM | POA: Diagnosis present

## 2020-03-31 DIAGNOSIS — J449 Chronic obstructive pulmonary disease, unspecified: Secondary | ICD-10-CM | POA: Diagnosis present

## 2020-03-31 DIAGNOSIS — R0603 Acute respiratory distress: Secondary | ICD-10-CM

## 2020-03-31 DIAGNOSIS — D696 Thrombocytopenia, unspecified: Secondary | ICD-10-CM | POA: Diagnosis present

## 2020-03-31 DIAGNOSIS — R0609 Other forms of dyspnea: Secondary | ICD-10-CM | POA: Diagnosis not present

## 2020-03-31 DIAGNOSIS — Z888 Allergy status to other drugs, medicaments and biological substances status: Secondary | ICD-10-CM | POA: Diagnosis not present

## 2020-03-31 DIAGNOSIS — Z7982 Long term (current) use of aspirin: Secondary | ICD-10-CM | POA: Diagnosis not present

## 2020-03-31 DIAGNOSIS — I4819 Other persistent atrial fibrillation: Secondary | ICD-10-CM | POA: Diagnosis present

## 2020-03-31 DIAGNOSIS — I509 Heart failure, unspecified: Secondary | ICD-10-CM | POA: Diagnosis present

## 2020-03-31 DIAGNOSIS — J9601 Acute respiratory failure with hypoxia: Secondary | ICD-10-CM | POA: Diagnosis not present

## 2020-03-31 DIAGNOSIS — I219 Acute myocardial infarction, unspecified: Secondary | ICD-10-CM | POA: Insufficient documentation

## 2020-03-31 DIAGNOSIS — K5900 Constipation, unspecified: Secondary | ICD-10-CM | POA: Diagnosis not present

## 2020-03-31 DIAGNOSIS — R0600 Dyspnea, unspecified: Secondary | ICD-10-CM

## 2020-03-31 DIAGNOSIS — Z20822 Contact with and (suspected) exposure to covid-19: Secondary | ICD-10-CM | POA: Diagnosis present

## 2020-03-31 HISTORY — DX: Chronic obstructive pulmonary disease, unspecified: J44.9

## 2020-03-31 HISTORY — DX: Heart failure, unspecified: I50.9

## 2020-03-31 LAB — CBC WITH DIFFERENTIAL/PLATELET
Abs Immature Granulocytes: 0.12 10*3/uL — ABNORMAL HIGH (ref 0.00–0.07)
Basophils Absolute: 0 10*3/uL (ref 0.0–0.1)
Basophils Relative: 0 %
Eosinophils Absolute: 0.1 10*3/uL (ref 0.0–0.5)
Eosinophils Relative: 0 %
HCT: 38.1 % (ref 36.0–46.0)
Hemoglobin: 12.3 g/dL (ref 12.0–15.0)
Immature Granulocytes: 1 %
Lymphocytes Relative: 6 %
Lymphs Abs: 1.1 10*3/uL (ref 0.7–4.0)
MCH: 31.1 pg (ref 26.0–34.0)
MCHC: 32.3 g/dL (ref 30.0–36.0)
MCV: 96.5 fL (ref 80.0–100.0)
Monocytes Absolute: 0.8 10*3/uL (ref 0.1–1.0)
Monocytes Relative: 4 %
Neutro Abs: 16.7 10*3/uL — ABNORMAL HIGH (ref 1.7–7.7)
Neutrophils Relative %: 89 %
Platelets: 149 10*3/uL — ABNORMAL LOW (ref 150–400)
RBC: 3.95 MIL/uL (ref 3.87–5.11)
RDW: 14.6 % (ref 11.5–15.5)
WBC: 18.8 10*3/uL — ABNORMAL HIGH (ref 4.0–10.5)
nRBC: 0 % (ref 0.0–0.2)

## 2020-03-31 LAB — SARS CORONAVIRUS 2 BY RT PCR (HOSPITAL ORDER, PERFORMED IN ~~LOC~~ HOSPITAL LAB): SARS Coronavirus 2: NEGATIVE

## 2020-03-31 LAB — COMPREHENSIVE METABOLIC PANEL
ALT: 24 U/L (ref 0–44)
AST: 35 U/L (ref 15–41)
Albumin: 3 g/dL — ABNORMAL LOW (ref 3.5–5.0)
Alkaline Phosphatase: 78 U/L (ref 38–126)
Anion gap: 12 (ref 5–15)
BUN: 26 mg/dL — ABNORMAL HIGH (ref 8–23)
CO2: 22 mmol/L (ref 22–32)
Calcium: 9.3 mg/dL (ref 8.9–10.3)
Chloride: 101 mmol/L (ref 98–111)
Creatinine, Ser: 1.19 mg/dL — ABNORMAL HIGH (ref 0.44–1.00)
GFR calc Af Amer: 48 mL/min — ABNORMAL LOW (ref >60)
GFR calc non Af Amer: 41 mL/min — ABNORMAL LOW (ref >60)
Glucose, Bld: 124 mg/dL — ABNORMAL HIGH (ref 70–99)
Potassium: 3.9 mmol/L (ref 3.5–5.1)
Sodium: 135 mmol/L (ref 135–145)
Total Bilirubin: 1.1 mg/dL (ref 0.3–1.2)
Total Protein: 5.7 g/dL — ABNORMAL LOW (ref 6.5–8.1)

## 2020-03-31 LAB — TROPONIN I (HIGH SENSITIVITY): Troponin I (High Sensitivity): 2574 ng/L (ref <18)

## 2020-03-31 LAB — BRAIN NATRIURETIC PEPTIDE: B Natriuretic Peptide: 873 pg/mL — ABNORMAL HIGH (ref 0.0–100.0)

## 2020-03-31 SURGERY — LEFT HEART CATH AND CORONARY ANGIOGRAPHY
Anesthesia: LOCAL

## 2020-03-31 MED ORDER — METOPROLOL TARTRATE 25 MG PO TABS
25.0000 mg | ORAL_TABLET | Freq: Two times a day (BID) | ORAL | Status: DC
  Administered 2020-04-01 – 2020-04-02 (×3): 25 mg via ORAL
  Filled 2020-03-31 (×3): qty 1

## 2020-03-31 MED ORDER — MIRTAZAPINE 15 MG PO TABS
7.5000 mg | ORAL_TABLET | Freq: Every day | ORAL | Status: DC
  Administered 2020-04-01 – 2020-04-04 (×5): 7.5 mg via ORAL
  Filled 2020-03-31 (×6): qty 1

## 2020-03-31 MED ORDER — PANTOPRAZOLE SODIUM 40 MG PO TBEC
40.0000 mg | DELAYED_RELEASE_TABLET | Freq: Every day | ORAL | Status: DC
  Administered 2020-04-01 – 2020-04-02 (×2): 40 mg via ORAL
  Filled 2020-03-31 (×2): qty 1

## 2020-03-31 MED ORDER — LIDOCAINE HCL (PF) 1 % IJ SOLN
INTRAMUSCULAR | Status: AC
  Filled 2020-03-31: qty 30

## 2020-03-31 MED ORDER — HEPARIN (PORCINE) 25000 UT/250ML-% IV SOLN
800.0000 [IU]/h | INTRAVENOUS | Status: DC
  Administered 2020-03-31: 800 [IU]/h via INTRAVENOUS
  Filled 2020-03-31: qty 250

## 2020-03-31 MED ORDER — SODIUM CHLORIDE 0.9 % IV BOLUS
100.0000 mL | Freq: Once | INTRAVENOUS | Status: AC
  Administered 2020-03-31: 100 mL via INTRAVENOUS

## 2020-03-31 MED ORDER — HEPARIN SODIUM (PORCINE) 5000 UNIT/ML IJ SOLN
60.0000 [IU]/kg | Freq: Once | INTRAMUSCULAR | Status: AC
  Administered 2020-03-31: 4000 [IU] via INTRAVENOUS

## 2020-03-31 MED ORDER — ONDANSETRON HCL 4 MG/2ML IJ SOLN: 4.0000 mg | Freq: Four times a day (QID) | INTRAMUSCULAR | Status: DC | PRN

## 2020-03-31 MED ORDER — IOHEXOL 350 MG/ML SOLN
INTRAVENOUS | Status: AC
  Filled 2020-03-31: qty 1

## 2020-03-31 MED ORDER — HEPARIN (PORCINE) 25000 UT/250ML-% IV SOLN: 12.0000 [IU]/kg/h | INTRAVENOUS | Status: DC

## 2020-03-31 MED ORDER — MORPHINE SULFATE (PF) 2 MG/ML IV SOLN: 1.0000 mg | INTRAVENOUS | Status: DC | PRN

## 2020-03-31 MED ORDER — IPRATROPIUM-ALBUTEROL 0.5-2.5 (3) MG/3ML IN SOLN
3.0000 mL | Freq: Four times a day (QID) | RESPIRATORY_TRACT | Status: DC
  Administered 2020-03-31: 3 mL via RESPIRATORY_TRACT
  Filled 2020-03-31: qty 3

## 2020-03-31 MED ORDER — FLUTICASONE FUROATE-VILANTEROL 100-25 MCG/INH IN AEPB
1.0000 | INHALATION_SPRAY | Freq: Every day | RESPIRATORY_TRACT | Status: DC
  Administered 2020-04-03 – 2020-04-04 (×2): 1 via RESPIRATORY_TRACT
  Filled 2020-03-31: qty 28

## 2020-03-31 MED ORDER — ASPIRIN 81 MG PO TBEC: 81.0000 mg | DELAYED_RELEASE_TABLET | ORAL | Status: DC

## 2020-03-31 MED ORDER — ONDANSETRON HCL 4 MG PO TABS: 4.0000 mg | ORAL_TABLET | Freq: Four times a day (QID) | ORAL | Status: DC | PRN

## 2020-03-31 MED ORDER — HEPARIN (PORCINE) IN NACL 1000-0.9 UT/500ML-% IV SOLN
INTRAVENOUS | Status: AC
  Filled 2020-03-31: qty 1000

## 2020-03-31 NOTE — ED Notes (Signed)
Toniann Fail MD at bedside to reassess.

## 2020-03-31 NOTE — Progress Notes (Signed)
ANTICOAGULATION CONSULT NOTE  Pharmacy Consult for Heparin Indication: chest pain/ACS  Allergies  Allergen Reactions  . Codeine Shortness Of Breath and Palpitations  . Penicillins Hives    Patient Measurements: Height: 5\' 3"  (160 cm) Weight: 67 kg (147 lb 11.3 oz) IBW/kg (Calculated) : 52.4 Heparin Dosing Weight: 65.9 kg  Vital Signs: Temp: 98.6 F (37 C) (05/18 1745) Temp Source: Oral (05/18 1745) BP: 100/58 (05/18 1845) Pulse Rate: 103 (05/18 1845)  Labs: Recent Labs    03/31/20 1743  HGB 12.3  HCT 38.1  PLT 149*  CREATININE 1.19*  TROPONINIHS 2,574*    Estimated Creatinine Clearance: 30.6 mL/min (A) (by C-G formula based on SCr of 1.19 mg/dL (H)).   Medical History: Past Medical History:  Diagnosis Date  . CHF (congestive heart failure) (HCC)   . COPD (chronic obstructive pulmonary disease) (HCC)   . Hypertension     Medications:  Scheduled:  . heparin  60 Units/kg Intravenous Once    Assessment: Patient is a 39 yof that presented to the ED with SOB. The patient was found to have an elevated Trop. Pharmacy has been asked to dose heparin at this time for ACS.   Goal of Therapy:  Heparin level 0.3-0.7 units/ml Monitor platelets by anticoagulation protocol: Yes   Plan:  - Heparin bolus 4000 untis IV x 1 dose - Heparin drip @ 800 units/hr - Heparin level in ~ 6 hours  - Monitor patient for s/s of bleeding and CBC while on heparin   97 PharmD. BCPS  03/31/2020,7:07 PM

## 2020-03-31 NOTE — ED Notes (Signed)
Pt noted to be hypoxic, 85% 6/L [place], admitting paged.

## 2020-03-31 NOTE — H&P (Signed)
History and Physical    EMINE LOPATA OIZ:[id number] DOB: [birth year] DOA: 03/31/2020  PCP: [name], MD  Patient coming from: Home.  Chief Complaint: Shortness of breath.  HPI: IRETHA KIRLEY is a 86 y.o. female with history of COPD was recently admitted at [facility] for COPD exacerbation and patient had stayed at the hospital for almost 17 days discharged home yesterday was found to be increasingly weak short of breath by patient's home health aide and was referred to the ER.  Patient denies any chest pain but has been feeling weak and more short of breath.  Denies any abdominal pain nausea vomiting productive cough fever chills.  ED Course: In the ER patient's EKG shows acute ST elevation MI in the inferior leads chest x-ray unremarkable on-call cardiologist Dr. Excell Seltzer was consulted.  At this time patient and patient's son has requested no aggressive measures and the plan is to keep patient comfortable.  Patient has become more progressively short of breath and hypoxic.  Labs do show troponin of 2500 with BNP of 873.  Covid test was negative WBC was 18.80 creatinine 1.1.  Review of Systems: As per HPI, rest all negative.   Past Medical History:  Diagnosis Date  . CHF (congestive heart failure) (HCC)   . COPD (chronic obstructive pulmonary disease) (HCC)   . Hypertension     Past Surgical History:  Procedure Laterality Date  . APPENDECTOMY    . CHOLECYSTECTOMY       reports that she quit smoking about 18 years ago. Her smoking use included cigarettes. She has a 22.50 pack-year smoking history. She has never used smokeless tobacco. She reports that she does not drink alcohol or use drugs.  Allergies  Allergen Reactions  . Codeine Shortness Of Breath and Palpitations  . Albuterol Other (See Comments)    tremor  . Bupropion Other (See Comments)    unknown  . Calcitonin ([place]) Diarrhea  . Penicillins Hives    Family History  Problem Relation Age of Onset  .  Emphysema Mother        smoked  . Allergies Mother   . Emphysema Maternal Aunt        smoked  . Emphysema Sister        smoked  . Heart disease Father     Prior to Admission medications   Medication Sig Start Date End Date Taking? Authorizing Provider  apixaban (ELIQUIS) 5 MG TABS tablet Take 5 mg by mouth daily.  03/25/20  Yes [provider]  aspirin 81 MG EC tablet Take 81 mg by mouth once a week.    Yes [provider]  Calcium Carbonate-Vit D-Min (CALCIUM 600+D PLUS MINERALS) 600-400 MG-UNIT CHEW Chew 1 tablet by mouth every other day.   Yes [provider]  cholecalciferol (VITAMIN D) 1000 UNITS tablet Take 1,000 Units by mouth daily.   Yes [provider]  diltiazem (TIAZAC) 240 MG 24 hr capsule Take 240 mg by mouth daily.  03/30/20  Yes [provider]  fluticasone furoate-vilanterol (BREO ELLIPTA) 100-25 MCG/INH AEPB Inhale 1 puff into the lungs daily.   Yes [provider]  Furosemide (LASIX PO) Take 1 tablet by mouth daily.   Yes [provider]  ipratropium-albuterol (DUONEB) 0.5-2.5 (3) MG/3ML SOLN Take 3 mLs by nebulization 4 (four) times daily. 08/07/15  Yes Nyoka Cowden, MD  Lactobacillus Acid-Pectin (ACIDOPHILUS/PECTIN) CAPS Take 1 capsule by mouth daily.    Yes [provider]  metoprolol tartrate (LOPRESSOR) 25 MG tablet Take 25 mg by mouth 2 (two) times daily.  12/20/19  Yes [provider]  mirtazapine (REMERON) 7.5 MG tablet Take 7.5 mg by mouth at bedtime.  12/23/19  Yes [provider]  Multiple Vitamin (MULTI-VITAMIN) tablet Take 1 tablet by mouth daily.    Yes [provider]  Multiple Vitamins-Minerals (MULTIVITAMIN WITH MINERALS) tablet Take 1 tablet by mouth every other day.   Yes [provider]  omeprazole (PRILOSEC) 20 MG capsule Take 20 mg by mouth daily.   Yes [provider]  potassium chloride SA (KLOR-CON) 20 MEQ tablet Take 20 mEq by mouth  daily.  03/12/20  Yes [provider]  senna-docusate (SENOKOT-S) 8.6-50 MG tablet Take 1 tablet by mouth at bedtime.  03/25/20  Yes [provider]    Physical Exam: Constitutional: Moderately built and nourished. Vitals:   03/31/20 1845 03/31/20 2015 03/31/20 2100 03/31/20 2130  BP: (!) 100/58 (!) 101/56 (!) 104/58 109/85  Pulse: (!) 103 92 100 94  Resp: 17 18 18 19   Temp:      TempSrc:      SpO2: 91% 95% 94% (!) 88%  Weight:      Height:       Eyes: Anicteric no pallor. ENMT: No discharge from the ears eyes nose or mouth. Neck: No mass felt.  No JVD appreciated. Respiratory: No rhonchi or crepitations. Cardiovascular: S1-S2 heard. Abdomen: Soft nontender bowel sounds present. Musculoskeletal: No edema. Skin: No rash. Neurologic: Alert awake oriented to time place and person.  Moves all extremities. Psychiatric: Appears normal but normal affect.   Labs on Admission: I have personally reviewed following labs and imaging studies  CBC: Recent Labs  Lab 03/31/20 1743  WBC 18.8*  NEUTROABS 16.7*  HGB 12.3  HCT 38.1  MCV 96.5  PLT 149*   Basic Metabolic Panel: Recent Labs  Lab 03/31/20 1743  NA 135  K 3.9  CL 101  CO2 22  GLUCOSE 124*  BUN 26*  CREATININE 1.19*  CALCIUM 9.3   GFR: Estimated Creatinine Clearance: 30.6 mL/min (A) (by C-G formula based on SCr of 1.19 mg/dL (H)). Liver Function Tests: Recent Labs  Lab 03/31/20 1743  AST 35  ALT 24  ALKPHOS 78  BILITOT 1.1  PROT 5.7*  ALBUMIN 3.0*   No results for input(s): LIPASE, AMYLASE in the last 168 hours. No results for input(s): AMMONIA in the last 168 hours. Coagulation Profile: No results for input(s): INR, PROTIME in the last 168 hours. Cardiac Enzymes: No results for input(s): CKTOTAL, CKMB, CKMBINDEX, TROPONINI in the last 168 hours. BNP (last 3 results) No results for input(s): PROBNP in the last 8760 hours. HbA1C: No results for input(s): HGBA1C in the last 72  hours. CBG: No results for input(s): GLUCAP in the last 168 hours. Lipid Profile: No results for input(s): CHOL, HDL, LDLCALC, TRIG, CHOLHDL, LDLDIRECT in the last 72 hours. Thyroid Function Tests: No results for input(s): TSH, T4TOTAL, FREET4, T3FREE, THYROIDAB in the last 72 hours. Anemia Panel: No results for input(s): VITAMINB12, FOLATE, FERRITIN, TIBC, IRON, RETICCTPCT in the last 72 hours. Urine analysis: No results found for: COLORURINE, APPEARANCEUR, LABSPEC, PHURINE, GLUCOSEU, HGBUR, BILIRUBINUR, KETONESUR, PROTEINUR, UROBILINOGEN, NITRITE, LEUKOCYTESUR Sepsis Labs: @LABRCNTIP (procalcitonin:4,lacticidven:4) ) Recent Results (from the past 240 hour(s))  SARS Coronavirus 2 by RT PCR (hospital order, performed in [facility] hospital lab) Nasopharyngeal Nasopharyngeal Swab     Status: None   Collection Time: 03/31/20  7:14 PM  Specimen: Nasopharyngeal Swab  Result Value Ref Range Status   SARS Coronavirus 2 NEGATIVE NEGATIVE Final    Comment: (NOTE) SARS-CoV-2 target nucleic acids are NOT DETECTED. The SARS-CoV-2 RNA is generally detectable in upper and lower respiratory specimens during the acute phase of infection. The lowest concentration of SARS-CoV-2 viral copies this assay can detect is 250 copies / mL. A negative result does not preclude SARS-CoV-2 infection and should not be used as the sole basis for treatment or other patient management decisions.  A negative result may occur with improper specimen collection / handling, submission of specimen other than nasopharyngeal swab, presence of viral mutation(s) within the areas targeted by this assay, and inadequate number of viral copies (<250 copies / mL). A negative result must be combined with clinical observations, patient history, and epidemiological information. Fact Sheet for Patients:   StrictlyIdeas.no Fact Sheet for Healthcare Providers: BankingDealers.co.za This  test is not yet approved or cleared  by the [place] FDA and has been authorized for detection and/or diagnosis of SARS-CoV-2 by FDA under an Emergency Use Authorization (EUA).  This EUA will remain in effect (meaning this test can be used) for the duration of the COVID-19 declaration under Section 564(b)(1) of the Act, 21 U.S.C. section 360bbb-3(b)(1), unless the authorization is terminated or revoked sooner. Performed at [facility], [place] [address] [place], [place] [address]      Radiological Exams on Admission: DG Chest Portable 1 View  Result Date: 03/31/2020 CLINICAL DATA:  Shortness of breath. Hypoxia. Congestive heart failure. EXAM: PORTABLE CHEST 1 VIEW COMPARISON:  03/25/2020 FINDINGS: Stable cardiomegaly. Aortic atherosclerosis noted. Stable scarring in both lung bases and right upper lobe. No evidence of acute infiltrate or pleural effusion. IMPRESSION: Stable cardiomegaly, and bibasilar and right upper lobe scarring. No acute findings. Electronically Signed   By: Marlaine Hind M.D.   On: 03/31/2020 18:34    EKG: Independently reviewed.  ST elevation inferior leads.  Assessment/Plan Active Problems:   Essential hypertension   COPD GOLD II    Acute ST elevation myocardial infarction (STEMI) of inferior wall (HCC)   Acute respiratory failure with hypoxia (HCC)   A-fib (HCC)   ARF (acute renal failure) (HCC)    1. Acute ST elevation MI. 2. Acute respiratory failure hypoxia secondary to ST MI and COPD. 3. A. fib on anticoagulation. 4. Acute renal failure. 5. Leukocytosis.  Plan -I have discussed with patient and patient's son Mr. Marya Amsler at this time plan is to keep patient comfortable to continue oral medication patient is on including heparin but no further labs added morphine for comfort palliative care has been consulted patient is a DNR.  Given that patient has acute ST elevation MI with worsening respiratory status patient will need inpatient  status.   DVT prophylaxis: Heparin. Code Status: DNR. Family Communication: Patient's son. Disposition Plan: To be determined. Consults called: Cardiology and palliative care. Admission status: Inpatient.   Rise Patience MD [facility] Pager [phone].  If 7PM-7AM, please contact night-coverage [email or web address] Password [facility]  03/31/2020, 10:15 PM

## 2020-03-31 NOTE — ED Provider Notes (Signed)
[facility] EMERGENCY DEPARTMENT Provider Note   CSN: [id number] Arrival date & time: 03/31/20  1732     History Chief Complaint  Patient presents with  . Shortness of Breath    Megan Hurst is a 86 y.o. female past history of CHF, COPD, hypertension brought in by EMS for evaluation of respiratory distress.  Per EMS, patient was recently discharged from [facility] after a 17-day stay for CHF.  She was discharged home by herself.  Patient is on 3 L of oxygen home O2.  EMS got a call from the home health nurse and sister-in-law who came to check on patient.  They found her sitting in the recliner severely short of breath, unable to breathe.  Patient states that she sat in a recliner all night because she could not get up secondary to weakness and difficulty breathing.  On EMS arrival, patient was satting 78% on 3 L O2.  They put her on 5 which bumped her up to 90-92%.  On their evaluation, her twelve-lead was concerning for inferior MI and she was activated as a code STEMI.  Patient denies any chest pain.  She states she has had a dry cough.  She states she continues to have swelling in her legs even though she has recently had a bump in her Lasix (from 40 mg to 60 mg).  She denies any fevers, abdominal pain, nausea/vomiting.  The history is provided by the patient.       Past Medical History:  Diagnosis Date  . CHF (congestive heart failure) (HCC)   . COPD (chronic obstructive pulmonary disease) (HCC)   . Hypertension     Patient Active Problem List   Diagnosis Date Noted  . Acute MI (HCC) 03/31/2020  . Acute ST elevation myocardial infarction (STEMI) of inferior wall (HCC)   . COPD GOLD II  07/25/2015  . Essential hypertension 06/27/2015  . Solitary pulmonary nodule 06/11/2015  . Pulmonary infiltrates new since plain cxr 04/15/15  06/11/2015  . Upper airway cough syndrome 06/11/2015  . Valvular heart disease 06/10/2015    Past Surgical History:    Procedure Laterality Date  . APPENDECTOMY    . CHOLECYSTECTOMY       OB History   No obstetric history on file.     Family History  Problem Relation Age of Onset  . Emphysema Mother        smoked  . Allergies Mother   . Emphysema Maternal Aunt        smoked  . Emphysema Sister        smoked  . Heart disease Father     Social History   Tobacco Use  . Smoking status: Former Smoker    Packs/day: 0.50    Years: 45.00    Pack years: 22.50    Types: Cigarettes    Quit date: 11/14/2001    Years since quitting: 18.3  . Smokeless tobacco: Never Used  Substance Use Topics  . Alcohol use: No    Alcohol/week: 0.0 standard drinks  . Drug use: No    Home Medications Prior to Admission medications   Medication Sig Start Date End Date Taking? Authorizing Provider  apixaban (ELIQUIS) 5 MG TABS tablet Take 5 mg by mouth daily.  03/25/20  Yes [provider]  aspirin 81 MG EC tablet Take 81 mg by mouth once a week.    Yes [provider]  Calcium Carbonate-Vit D-Min (CALCIUM 600+D PLUS MINERALS) 600-400 MG-UNIT  CHEW Chew 1 tablet by mouth every other day.   Yes [provider]  cholecalciferol (VITAMIN D) 1000 UNITS tablet Take 1,000 Units by mouth daily.   Yes [provider]  diltiazem (TIAZAC) 240 MG 24 hr capsule Take 240 mg by mouth daily.  03/30/20  Yes [provider]  fluticasone furoate-vilanterol (BREO ELLIPTA) 100-25 MCG/INH AEPB Inhale 1 puff into the lungs daily.   Yes [provider]  Furosemide (LASIX PO) Take 1 tablet by mouth daily.   Yes [provider]  ipratropium-albuterol (DUONEB) 0.5-2.5 (3) MG/3ML SOLN Take 3 mLs by nebulization 4 (four) times daily. 08/07/15  Yes Nyoka Cowden, MD  Lactobacillus Acid-Pectin (ACIDOPHILUS/PECTIN) CAPS Take 1 capsule by mouth daily.    Yes [provider]  metoprolol tartrate (LOPRESSOR) 25 MG tablet Take 25 mg by mouth 2 (two) times daily.  12/20/19  Yes  [provider]  mirtazapine (REMERON) 7.5 MG tablet Take 7.5 mg by mouth at bedtime.  12/23/19  Yes [provider]  Multiple Vitamin (MULTI-VITAMIN) tablet Take 1 tablet by mouth daily.    Yes [provider]  Multiple Vitamins-Minerals (MULTIVITAMIN WITH MINERALS) tablet Take 1 tablet by mouth every other day.   Yes [provider]  omeprazole (PRILOSEC) 20 MG capsule Take 20 mg by mouth daily.   Yes [provider]  potassium chloride SA (KLOR-CON) 20 MEQ tablet Take 20 mEq by mouth daily.  03/12/20  Yes [provider]  senna-docusate (SENOKOT-S) 8.6-50 MG tablet Take 1 tablet by mouth at bedtime.  03/25/20  Yes [provider]    Allergies    Codeine, Albuterol, Bupropion, Calcitonin (salmon), and Penicillins  Review of Systems   Review of Systems  Constitutional: Negative for fever.  Respiratory: Positive for cough and shortness of breath.   Cardiovascular: Negative for chest pain.  Gastrointestinal: Negative for abdominal pain, nausea and vomiting.  Genitourinary: Negative for dysuria and hematuria.  Neurological: Negative for headaches.  All other systems reviewed and are negative.   Physical Exam Updated Vital Signs BP (!) 104/58   Pulse 100   Temp 98.6 F (37 C) (Oral)   Resp 18   Ht 5\' 3"  (1.6 m)   Wt 67 kg   SpO2 94%   BMI 26.17 kg/m   Physical Exam Vitals and nursing note reviewed.  Constitutional:      Appearance: Normal appearance. She is well-developed. She is ill-appearing.  HENT:     Head: Normocephalic and atraumatic.  Eyes:     General: Lids are normal.     Conjunctiva/sclera: Conjunctivae normal.     Pupils: Pupils are equal, round, and reactive to light.  Cardiovascular:     Rate and Rhythm: Normal rate and regular rhythm.     Pulses: Normal pulses.     Heart sounds: Normal heart sounds. No murmur. No friction rub. No gallop.   Pulmonary:     Effort: Respiratory distress present.      Breath sounds: Decreased air movement present.  Abdominal:     Palpations: Abdomen is soft. Abdomen is not rigid.     Tenderness: There is no abdominal tenderness. There is no guarding.  Musculoskeletal:        General: Normal range of motion.     Cervical back: Full passive range of motion without pain.  Skin:    General: Skin is warm and dry.     Capillary Refill: Capillary refill takes less than 2 seconds.  Neurological:  Mental Status: She is alert and oriented to person, place, and time.  Psychiatric:        Speech: Speech normal.     ED Results / Procedures / Treatments   Labs (all labs ordered are listed, but only abnormal results are displayed) Labs Reviewed  COMPREHENSIVE METABOLIC PANEL - Abnormal; Notable for the following components:      Result Value   Glucose, Bld 124 (*)    BUN 26 (*)    Creatinine, Ser 1.19 (*)    Total Protein 5.7 (*)    Albumin 3.0 (*)    GFR calc non Af Amer 41 (*)    GFR calc Af Amer 48 (*)    All other components within normal limits  CBC WITH DIFFERENTIAL/PLATELET - Abnormal; Notable for the following components:   WBC 18.8 (*)    Platelets 149 (*)    Neutro Abs 16.7 (*)    Abs Immature Granulocytes 0.12 (*)    All other components within normal limits  BRAIN NATRIURETIC PEPTIDE - Abnormal; Notable for the following components:   B Natriuretic Peptide 873.0 (*)    All other components within normal limits  TROPONIN I (HIGH SENSITIVITY) - Abnormal; Notable for the following components:   Troponin I (High Sensitivity) 2,574 (*)    All other components within normal limits  SARS CORONAVIRUS 2 BY RT PCR (HOSPITAL ORDER, [place] [facility])  URINALYSIS, ROUTINE W REFLEX MICROSCOPIC  HEPARIN LEVEL (UNFRACTIONATED)  CBC  TROPONIN I (HIGH SENSITIVITY)    EKG EKG Interpretation  Date/Time:  Tuesday Mar 31 2020 17:43:22 EDT Ventricular Rate:  102 PR Interval:    QRS Duration: 96 QT Interval:  273 QTC  Calculation: 356 R Axis:   35 Text Interpretation: Atrial fibrillation Ventricular premature complex Abnormal R-wave progression, late transition Inferior infarct, acute (RCA) Probable RV involvement, suggest recording right precordial leads >>> Acute MI <<< no prior ECG Confirmed by Dorie Rank [id number]) on 03/31/2020 5:47:27 PM   Radiology DG Chest Portable 1 View  Result Date: 03/31/2020 CLINICAL DATA:  Shortness of breath. Hypoxia. Congestive heart failure. EXAM: PORTABLE CHEST 1 VIEW COMPARISON:  03/25/2020 FINDINGS: Stable cardiomegaly. Aortic atherosclerosis noted. Stable scarring in both lung bases and right upper lobe. No evidence of acute infiltrate or pleural effusion. IMPRESSION: Stable cardiomegaly, and bibasilar and right upper lobe scarring. No acute findings. Electronically Signed   By: Marlaine Hind M.D.   On: 03/31/2020 18:34    Procedures .Critical Care Performed by: Volanda Napoleon, PA-C Authorized by: Volanda Napoleon, PA-C   Critical care provider statement:    Critical care time (minutes):  35   Critical care was necessary to treat or prevent imminent or life-threatening deterioration of the following conditions:  Circulatory failure, cardiac failure and respiratory failure   Critical care was time spent personally by me on the following activities:  Discussions with consultants, evaluation of patient's response to treatment, examination of patient, ordering and performing treatments and interventions, ordering and review of laboratory studies, ordering and review of radiographic studies, pulse oximetry, re-evaluation of patient's condition, obtaining history from patient or surrogate and review of old charts   (including critical care time)  Medications Ordered in ED Medications  heparin ADULT infusion 100 units/mL (25000 units/279mL sodium chloride 0.45%) (800 Units/hr Intravenous New Bag/Given 03/31/20 1940)  sodium chloride 0.9 % bolus 100 mL (0 mLs Intravenous  Stopped 03/31/20 2023)  heparin injection 4,000 Units (4,000 Units Intravenous Given 03/31/20 1941)  ED Course  I have reviewed the triage vital signs and the nursing notes.  Pertinent labs & imaging results that were available during my care of the patient were reviewed by me and considered in my medical decision making (see chart for details).    MDM Rules/Calculators/A&P                      86 year old female past history of COPD, A. fib (on diltiazem), CHFwho presents for evaluation of respiratory distress.  Patient recently discharged from [facility] for CHF and COPD exacerbation.  Was discharged home.  Patient found by nurse and sister-in-law with acute respiratory distress.  On EMS arrival, she was satting 78% on 3 L O2.  On EMS arrival, they had an EKG that was suspicious for a code STEMI.  Initially, code STEMI was activated.  Dr. Excell Seltzer notified.  When patient arrived to the ED, she was 83% on room air.  She was placed on her liters of oxygen which improved her sats in the low mid 90s.  Dr. Excell Seltzer evaluated patient at bedside.  He had a long discussion with both patient and son.  Patient had been discharged and did not want any any extensive measures taken.  He recommended holding off on heparin unless cardiac enzymes showed concern for ischemia.  Code STEMI canceled.  On EMS 12-lead, there was concern for possible STEMI.  Dr. Excell Seltzer (cardiology) evaluated the EKG and decided not to activate per patient's wishes for no intervention.  Dr. Excell Seltzer had talked to son regarding patient's wishes.  On discharge from [place], patient did not want any intensive measures taken.  Given the discussion, code STEMI was deactivated and patient was decided not to take to the Cath Lab.  Troponin is elevated at 2574.  BNP is elevated at 873.  CBC shows leukocytosis of 18.8.  CMP shows BUN of 26, creatinine of 1.19.  Chest x-ray shows cardiomegaly.  Heparin started.  I discussed with cards fellow  (Dr. Truman Hayward) who agrees with plans for medical management for the next 48 hours.  I discussed with patient and son.  Patient does not wish to have any intense measures taken.  I did discuss with her regarding heparin she is agreeable.  Additionally, both her and her son are agreeable for admission.  Palliative care consult notified.  Dr. Lynelle Doctor discussed with Dr. Toniann Fail (hospitalist) who accepts patient for admission.   Portions of this note were generated with Scientist, clinical (histocompatibility and immunogenetics). Dictation errors may occur despite best attempts at proofreading.   Final Clinical Impression(s) / ED Diagnoses Final diagnoses:  Myocardial ischemia  Respiratory distress    Rx / DC Orders ED Discharge Orders    None       Rosana Hoes 03/31/20 2141    Linwood Dibbles, MD 04/01/20 1625

## 2020-03-31 NOTE — ED Provider Notes (Signed)
Medical screening examination/treatment/procedure(s) were conducted as a shared visit with non-physician practitioner(s) and myself.  I personally evaluated the patient during the encounter.  EKG Interpretation  Date/Time:  Tuesday Mar 31 2020 17:43:22 EDT Ventricular Rate:  102 PR Interval:    QRS Duration: 96 QT Interval:  273 QTC Calculation: 356 R Axis:   35 Text Interpretation: Atrial fibrillation Ventricular premature complex Abnormal R-wave progression, late transition Inferior infarct, acute (RCA) Probable RV involvement, suggest recording right precordial leads >>> Acute MI <<< no prior ECG Confirmed by Linwood Dibbles [phone]) on 03/31/2020 5:47:27 PM  Case discussed with Dr Excell Seltzer.  Pt with complex medical issues.  Case was discussed with son.  No plans for cardiac catheterization, supportive care, medical admission, consider palliative care.   Linwood Dibbles, MD 04/01/20 1623

## 2020-03-31 NOTE — Consult Note (Signed)
Cardiology Consultation:   Patient ID: Megan Hurst MRN: [id number]; DOB: Sep 17, 1930  Admit date: 03/31/2020 Date of Consult: 03/31/2020  Primary Care Provider: [name], MD Primary Cardiologist: No primary care provider on file.  Primary Electrophysiologist:  None    Patient Profile:   Megan Hurst is a 86 y.o. female with a hx of severe COPD who is being seen today for the evaluation of STEMI at the request of Lynelle Doctor.  History of Present Illness:   [name] is a 86 year old woman who lives independently, but suffers from severe O2 dependent COPD.  She was just hospitalized at [facility] for 17 days.  She was discharged last night after her insurance declined for her to go to short-term skilled nursing.  The patient was apparently insistent about leaving the hospital even though she continued to struggle with shortness of breath.  She called EMS today because of worsening shortness of breath and was found to have an oxygen saturation in the 70s.  She was placed on an increasing amount of O2 in order to get her O2 saturation up into the low 90s.  During EMS evaluation, an EKG is done and it demonstrates atrial fibrillation with inferior ST elevation and reciprocal changes consistent with an inferior STEMI.  The patient is evaluated immediately on her arrival to [facility].  She denies chest pain or pressure.  She continues to complain of shortness of breath.  She has no fevers, chills, or other complaints.  She denies heart palpitations, lightheadedness, or syncope.  There are no records available to review from [facility].  The patient states that she was recently diagnosed with atrial fibrillation and she is now taking Eliquis.  Further history is then obtained from her son over the telephone.  He reports that the patient is a DNR.  She expressed to him that she wished to leave the hospital so that she could die in her home.  She would not want any aggressive measures  taken.   Past Medical History:  Diagnosis Date  . CHF (congestive heart failure) (HCC)   . COPD (chronic obstructive pulmonary disease) (HCC)   . Hypertension     Past Surgical History:  Procedure Laterality Date  . APPENDECTOMY    . CHOLECYSTECTOMY       Home Medications:  Prior to Admission medications   Medication Sig Start Date End Date Taking? Authorizing Provider  albuterol (PROAIR HFA) 108 (90 BASE) MCG/ACT inhaler Inhale 2 puffs into the lungs every 6 (six) hours as needed for wheezing or shortness of breath.    [provider]  Calcium Carbonate-Vit D-Min (CALCIUM 600+D PLUS MINERALS) 600-400 MG-UNIT CHEW Chew 1 tablet by mouth every other day.    [provider]  cetirizine (ZYRTEC) 10 MG tablet Take 10 mg by mouth daily.    [provider]  cholecalciferol (VITAMIN D) 1000 UNITS tablet Take 1,000 Units by mouth daily.    [provider]  imipramine (TOFRANIL) 25 MG tablet Take 25 mg by mouth at bedtime.    [provider]  ipratropium-albuterol (DUONEB) 0.5-2.5 (3) MG/3ML SOLN Take 3 mLs by nebulization 4 (four) times daily. 08/07/15   Nyoka Cowden, MD  Multiple Vitamins-Minerals (MULTIVITAMIN WITH MINERALS) tablet Take 1 tablet by mouth every other day.    [provider]  olmesartan (BENICAR) 20 MG tablet Take 20 mg by mouth daily.    [provider]  omeprazole (PRILOSEC) 20 MG capsule Take 20 mg  by mouth daily.    [provider]    Inpatient Medications: Scheduled Meds:  Continuous Infusions:  PRN Meds:   Allergies:    Allergies  Allergen Reactions  . Codeine Shortness Of Breath and Palpitations  . Penicillins Hives    Social History:   Social History   Socioeconomic History  . Marital status: Legally Separated    Spouse name: Not on file  . Number of children: Not on file  . Years of education: Not on file  . Highest education level: Not on file  Occupational History  .  Occupation: Retired  Tobacco Use  . Smoking status: Former Smoker    Packs/day: 0.50    Years: 45.00    Pack years: 22.50    Types: Cigarettes    Quit date: 11/14/2001    Years since quitting: 18.3  . Smokeless tobacco: Never Used  Substance and Sexual Activity  . Alcohol use: No    Alcohol/week: 0.0 standard drinks  . Drug use: No  . Sexual activity: Not on file  Other Topics Concern  . Not on file  Social History Narrative  . Not on file   Social Determinants of Health   Financial Resource Strain:   . Difficulty of Paying Living Expenses:   Food Insecurity:   . Worried About Charity fundraiser in the Last Year:   . Arboriculturist in the Last Year:   Transportation Needs:   . Film/video editor (Medical):   Marland Kitchen Lack of Transportation (Non-Medical):   Physical Activity:   . Days of Exercise per Week:   . Minutes of Exercise per Session:   Stress:   . Feeling of Stress :   Social Connections:   . Frequency of Communication with Friends and Family:   . Frequency of Social Gatherings with Friends and Family:   . Attends Religious Services:   . Active Member of Clubs or Organizations:   . Attends Archivist Meetings:   Marland Kitchen Marital Status:   Intimate Partner Violence:   . Fear of Current or Ex-Partner:   . Emotionally Abused:   Marland Kitchen Physically Abused:   . Sexually Abused:     Family History:   Family History  Problem Relation Age of Onset  . Emphysema Mother        smoked  . Allergies Mother   . Emphysema Maternal Aunt        smoked  . Emphysema Sister        smoked  . Heart disease Father      ROS:  Please see the history of present illness.  All other ROS reviewed and negative.     Physical Exam/Data:   Vitals:   03/31/20 1743 03/31/20 1744 03/31/20 1745 03/31/20 1746  BP: (!) 116/56  (!) 116/56 (!) 102/54  Pulse: (!) 108 84 77 (!) 103  Resp: (!) 21 (!) 23 (!) 21 20  Temp:   98.6 F (37 C)   TempSrc:   Oral   SpO2: 92% 91% 94% 94%    Weight:    67 kg  Height:    5\' 3"  (1.6 m)   No intake or output data in the 24 hours ending 03/31/20 1755 Last 3 Weights 03/31/2020 08/07/2015 07/24/2015  Weight (lbs) 147 lb 11.3 oz 147 lb 12.8 oz 153 lb 3.2 oz  Weight (kg) 67 kg 67.042 kg 69.491 kg     Body mass index is 26.17 kg/m.  General: Elderly  woman, on supplemental O2, no acute distress HEENT: normal Lymph: no adenopathy Neck: no JVD Endocrine:  No thryomegaly Vascular: No carotid bruits Cardiac: Irregularly irregular, no murmur or gallop Lungs: Diminished breath sounds bilaterally, scattered rhonchi Abd: soft, nontender, no hepatomegaly  Ext: no edema Musculoskeletal:  No deformities, BUE and BLE strength normal and equal Skin: warm and dry  Neuro:  CNs 2-12 intact, no focal abnormalities noted Psych:  Normal affect   EKG:  The EKG was personally reviewed and demonstrates: Atrial fibrillation with inferior infarct pattern, possibly acute  Telemetry:  Telemetry was personally reviewed and demonstrates: Atrial fibrillation  Relevant CV Studies: None, studies are currently pending  Laboratory Data:  High Sensitivity Troponin:  No results for input(s): TROPONINIHS in the last 720 hours.   ChemistryNo results for input(s): NA, K, CL, CO2, GLUCOSE, BUN, CREATININE, CALCIUM, GFRNONAA, GFRAA, ANIONGAP in the last 168 hours.  No results for input(s): PROT, ALBUMIN, AST, ALT, ALKPHOS, BILITOT in the last 168 hours. HematologyNo results for input(s): WBC, RBC, HGB, HCT, MCV, MCH, MCHC, RDW, PLT in the last 168 hours. BNPNo results for input(s): BNP, PROBNP in the last 168 hours.  DDimer No results for input(s): DDIMER in the last 168 hours.   Radiology/Studies:  No results found.     TIMI Risk Score for ST  Elevation MI:   The patient's TIMI risk score is 7, which indicates a 23.4% risk of all cause mortality at 30 days.    Assessment and Plan:   1. Acute on chronic respiratory failure with known severe  COPD 2. Possible inferior STEMI, patient chest pain-free 3. DNR 4. Recent onset atrial fibrillation, persistent  The patient has progressive problems with chronic respiratory failure from severe COPD.  She has been on home oxygen now for almost 5 years.  She was previously on 3 L, now on 5 L of oxygen.  While her EKG suggests inferior STEMI, I think it is entirely possible that she has had an old infarct as she has inferior Q waves.  She is having no chest pain at present.  Would recommend cycling cardiac enzymes.  The patient is anticoagulated with apixaban.  I probably would not use IV heparin unless her enzymes demonstrate a clear picture of acute MI.  Even with that, the patient does not wish for aggressive therapies.  I had a frank discussion with the patient and her son who is now at the bedside.  She wants a palliative approach to her care.  I specifically talked to her about hospice as she has a desire to die in her home.  She would like to talk to someone about the possibility of home hospice if she is able to be well enough to be discharged home.  I would recommend focusing on her comfort and minimizing any testing at this point.    Recommend admission to hospitalist service and palliative care consult. Thanks and please call if any questions.   For questions or updates, please contact [facility] Please consult [email or web address] for contact info under   Signed, Tonny Bollman, MD  03/31/2020 5:55 PM

## 2020-03-31 NOTE — ED Triage Notes (Signed)
Pt here from home with [facility]. Pt was admitted at [facility] for 17 days with CHF. EMS found her with SOB O2 was 78% and got her up to 90-92%. EKG with ems showed an MI in the inferior so they activated STEMI. Cards didn't activate stemi at the bridge. EMS gave 324 aspirin.  Hx of afib, chf, copd.

## 2020-03-31 NOTE — ED Notes (Signed)
Did not complete DG Chest Portable 1 View. Accidentally click off.

## 2020-04-01 ENCOUNTER — Encounter (HOSPITAL_COMMUNITY): Payer: Self-pay | Admitting: Internal Medicine

## 2020-04-01 DIAGNOSIS — I4891 Unspecified atrial fibrillation: Secondary | ICD-10-CM

## 2020-04-01 DIAGNOSIS — Z515 Encounter for palliative care: Secondary | ICD-10-CM

## 2020-04-01 DIAGNOSIS — Z66 Do not resuscitate: Secondary | ICD-10-CM

## 2020-04-01 LAB — CBC
HCT: 34 % — ABNORMAL LOW (ref 36.0–46.0)
Hemoglobin: 11.1 g/dL — ABNORMAL LOW (ref 12.0–15.0)
MCH: 31.1 pg (ref 26.0–34.0)
MCHC: 32.6 g/dL (ref 30.0–36.0)
MCV: 95.2 fL (ref 80.0–100.0)
Platelets: 135 10*3/uL — ABNORMAL LOW (ref 150–400)
RBC: 3.57 MIL/uL — ABNORMAL LOW (ref 3.87–5.11)
RDW: 14.8 % (ref 11.5–15.5)
WBC: 14.2 10*3/uL — ABNORMAL HIGH (ref 4.0–10.5)
nRBC: 0 % (ref 0.0–0.2)

## 2020-04-01 LAB — APTT
aPTT: 121 seconds — ABNORMAL HIGH (ref 24–36)
aPTT: 42 seconds — ABNORMAL HIGH (ref 24–36)
aPTT: 77 seconds — ABNORMAL HIGH (ref 24–36)

## 2020-04-01 LAB — HEPARIN LEVEL (UNFRACTIONATED): Heparin Unfractionated: >2.2 IU/mL — ABNORMAL HIGH (ref 0.30–0.70)

## 2020-04-01 MED ORDER — FUROSEMIDE 10 MG/ML IJ SOLN
40.0000 mg | INTRAMUSCULAR | Status: AC
  Administered 2020-04-01: 40 mg via INTRAVENOUS
  Filled 2020-04-01: qty 4

## 2020-04-01 MED ORDER — HEPARIN (PORCINE) 25000 UT/250ML-% IV SOLN
800.0000 [IU]/h | INTRAVENOUS | Status: DC
  Administered 2020-04-01: 700 [IU]/h via INTRAVENOUS
  Filled 2020-04-01: qty 250

## 2020-04-01 MED ORDER — IPRATROPIUM-ALBUTEROL 0.5-2.5 (3) MG/3ML IN SOLN
3.0000 mL | Freq: Two times a day (BID) | RESPIRATORY_TRACT | Status: DC
  Administered 2020-04-02 – 2020-04-04 (×6): 3 mL via RESPIRATORY_TRACT
  Filled 2020-04-01 (×6): qty 3

## 2020-04-01 MED ORDER — NYSTATIN 100000 UNIT/GM EX POWD
Freq: Two times a day (BID) | CUTANEOUS | Status: DC
  Filled 2020-04-01: qty 15

## 2020-04-01 NOTE — Progress Notes (Signed)
ANTICOAGULATION CONSULT NOTE - Follow Up Consult  Pharmacy Consult for Heparin  Indication: chest pain/ACS  Allergies  Allergen Reactions  . Codeine Shortness Of Breath and Palpitations  . Albuterol Other (See Comments)    tremor  . Bupropion Other (See Comments)    unknown  . Calcitonin ([place]) Diarrhea  . Penicillins Hives    Patient Measurements: Height: 5\' 3"  (160 cm) Weight: 62.6 kg (138 lb 0.1 oz) IBW/kg (Calculated) : 52.4 Heparin Dosing Weight: 62.6 kg  Vital Signs: Temp: 97.6 F (36.4 C) (05/19 1315) Temp Source: Oral (05/19 1315) BP: 110/56 (05/19 1315) Pulse Rate: 90 (05/19 1315)  Labs: Recent Labs    03/31/20 1743 04/01/20 0313 04/01/20 1314  HGB 12.3 11.1*  --   HCT 38.1 34.0*  --   PLT 149* 135*  --   APTT  --  121* 42*  HEPARINUNFRC  --  >2.20*  --   CREATININE 1.19*  --   --   TROPONINIHS 2,574*  --   --     Estimated Creatinine Clearance: 27.6 mL/min (A) (by C-G formula based on SCr of 1.19 mg/dL (H)).  Assessment: Patient is a 8 yof that presented to the ED with SOB. The patient was found to have an elevated Trop. Pharmacy consulted to dose heparin for ACS.  Patient was taking Eliquis 5 mg BID PTA for h/o Afib. Last taken on 5/18.   Initial heparin level elevated falsely due to prior eliquis.  APTT also above goal 121 sec this AM,  thus heparin rate was held x 1 hour then resumed at lower rate of 700 units/hr.  Now the 6 hour aPTT is 42 seconds on Heparin rate 700 units/hr. PTT is subtherapeutic.  No bleeding reported   Goal of Therapy:  aPTT 66-102 seconds  Heparin level 0.3-0.7 units/ml Monitor platelets by anticoagulation protocol: Yes   Plan:  Increase heparin drip to 800 units/hr Check aPTT in ~ 6 hours  Daily aPTT, HL, CBC Monitor patient for s/s of bleeding and CBC while on heparin    6/18, RPh Clinical Pharmacist [phone] Please check AMION for all [facility]acy phone numbers After 10:00 PM, call Main Pharmacy  [phone] 04/01/2020,2:19 PM

## 2020-04-01 NOTE — Consult Note (Signed)
Consultation Note Date: 04/01/2020   Patient Name: Megan Hurst  DOB: 1930-10-24  MRN: [id number]  Age / Sex: 86 y.o., female  PCP: Maris Berger, MD Referring Physician: Albertine Patricia, MD  Reason for Consultation: Establishing goals of care  HPI/Patient Profile: 86 y.o. female  with past medical history of severe COPD on home O2,  admitted on 03/31/2020 with increasing weakness and shortness of breath at home.  She was recently admitted at [facility] for COPD exacerbation and hospitalized for almost 17 days discharged home for 1 day, not doing well and presented to the ED. In the ER patient's EKG shows acute ST elevation MI.  Per hospitalist notes, patient and patient's son has requested no aggressive measures and the plan is to keep patient comfortable. Patient has become more progressively short of breath and hypoxic.   Clinical Assessment and Goals of Care: Reviewed medical records including EPIC notes, labs and imaging, examined the patient and met at bedside to discuss diagnosis prognosis, GOC, EOL wishes, disposition and options. Patient is initially asleep, but awakens easily to voice, oriented x 3.   Introduced Palliative Medicine as specialized medical care for people living with serious illness. It focuses on providing relief from the symptoms and stress of a serious illness.   We discussed a brief life review of the patient. She is originally from [place], [place]. 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 She raised 3 sons, and states she has "outlived all but one". She lives alone in an apartment in [place]. She reports having strong spiritual beliefs throughout most of her life, practicing in the [place].   We discussed her current illness and what it means in the larger context of her on-going co-morbidities.  Natural disease  trajectory and expectations at EOL were discussed.  The patient clearly states "I don't want to be poked and prodded any more". She has a clear understanding of the severity of her illness. She clearly states that comfort is a priority. She states she does not want to return home for end of life, and that her son Dominica Severin would not be able to care for her.   I called patient's son Dominica Severin to relay my conversation with his mother. Requested to meet with him tomorrow at bedside to make sure he and patient have a clear understanding of exactly what comfort care means (particularly related to oxygen requirements) and to discuss disposition options.   Primary decision maker: patient with support from son Dominica Severin.     SUMMARY OF RECOMMENDATIONS   - meeting with son at bedside tomorrow at 10:00 - plan for likely transition to comfort care after this discussion with patient and her son.   Code Status/Advance Care Planning:  DNR  Symptom Management:   Morphine 68m every 2 hours prn for shortness of breath or severe pain  Palliative Prophylaxis:   Bowel Regimen, Frequent Pain Assessment and Turn Reposition  Psycho-social/Spiritual:   Desire for  further Chaplaincy support:yes  Additional Recommendations: Education on Hospice  Prognosis:   < 6 months  Discharge Planning: To Be Determined      Primary Diagnoses: Present on Admission: . Essential hypertension . Acute ST elevation myocardial infarction (STEMI) of inferior wall (HCC) . Acute respiratory failure with hypoxia ([place]) . COPD GOLD II  . A-fib ([place]) . ARF (acute renal failure) ([place])   I have reviewed the medical record, interviewed the patient and family, and examined the patient. The following aspects are pertinent.  Past Medical History:  Diagnosis Date  . CHF (congestive heart failure) ([place])   . COPD (chronic obstructive pulmonary disease) ([place])   . Hypertension    Social History   Socioeconomic History  . Marital  status: Legally Separated    Spouse name: Not on file  . Number of children: Not on file  . Years of education: Not on file  . Highest education level: Not on file  Occupational History  . Occupation: Retired  Tobacco Use  . Smoking status: Former Smoker    Packs/day: 0.50    Years: 45.00    Pack years: 22.50    Types: Cigarettes    Quit date: 11/14/2001    Years since quitting: 18.3  . Smokeless tobacco: Never Used  Substance and Sexual Activity  . Alcohol use: No    Alcohol/week: 0.0 standard drinks  . Drug use: No  . Sexual activity: Not Currently  Other Topics Concern  . Not on file  Social History Narrative  . Not on file    Family History  Problem Relation Age of Onset  . Emphysema Mother        smoked  . Allergies Mother   . Emphysema Maternal Aunt        smoked  . Emphysema Sister        smoked  . Heart disease Father    Scheduled Meds: . [START ON 04/07/2020] aspirin  81 mg Oral Weekly  . fluticasone furoate-vilanterol  1 puff Inhalation Daily  . ipratropium-albuterol  3 mL Nebulization BID  . metoprolol tartrate  25 mg Oral BID  . mirtazapine  7.5 mg Oral QHS  . nystatin   Topical BID  . pantoprazole  40 mg Oral Daily   Continuous Infusions: . heparin 700 Units/hr (04/01/20 0636)   PRN Meds:.morphine injection, ondansetron **OR** ondansetron (ZOFRAN) IV  Allergies  Allergen Reactions  . Codeine Shortness Of Breath and Palpitations  . Albuterol Other (See Comments)    tremor  . Bupropion Other (See Comments)    unknown  . Calcitonin ([place]) Diarrhea  . Penicillins Hives   Review of Systems  Constitutional: Positive for fatigue.  Respiratory: Positive for shortness of breath.     Physical Exam Constitutional:      General: She is not in acute distress.    Appearance: She is ill-appearing.  HENT:     Head: Normocephalic and atraumatic.  Pulmonary:     Comments: Breathing labored at rest Neurological:     Mental Status: She is alert and  oriented to person, place, and time.     Vital Signs: BP (!) 110/56 (BP Location: Left Arm)   Pulse 90   Temp 97.6 F (36.4 C) (Oral)   Resp 18   Ht _0  (1.6 m)   Wt 62.6 kg   SpO2 (!) 88%   BMI 24.45 kg/m  Pain Scale: 0-10   Pain Score: 0-No pain   SpO2: SpO2: (!) 88 %  O2 Device:SpO2: (!) 88 % O2 Flow Rate: .O2 Flow Rate (L/min): 15 L/min  IO: Intake/output summary:   Intake/Output Summary (Last 24 hours) at 04/01/2020 1431 Last data filed at 03/31/2020 2023 Gross per 24 hour  Intake 100 ml  Output --  Net 100 ml    LBM: Last BM Date: 03/30/20 Baseline Weight: Weight: 67 kg Most recent weight: Weight: 62.6 kg      Palliative Assessment/Data: 30%   This NP was present and agrees with above assessment and plan  Time In: 13:30 Time Out: 14:40 Time Total: 70 minutes  Greater than 50%  of this time was spent counseling and coordinating care related to the above assessment and plan.  Signed by: Lavena Bullion, NP   Please contact Palliative Medicine Team phone at [phone] for questions and concerns.  For individual provider: See Shea Evans

## 2020-04-01 NOTE — Progress Notes (Signed)
ANTICOAGULATION CONSULT NOTE  Pharmacy Consult for Heparin Indication: chest pain/ACS  Assessment: Patient is a 53 yof that presented to the ED with SOB. The patient was found to have an elevated Trop. Pharmacy has been asked to dose heparin at this time for ACS.  Initial heparin level elevated falsely due to prior eliquis.  APTT also above goal 121 sec  Goal of Therapy:  APTT 66-102 sec Heparin level 0.3-0.7 units/ml Monitor platelets by anticoagulation protocol: Yes   Plan:  - Hold heparin for 1 hour - Restart heparin drip @ 700 units/hr - aPTT in ~ 6-8 hours  - Monitor patient for s/s of bleeding and CBC while on heparin   Thanks for allowing pharmacy to be a part of this patient's care.  Talbert Cage, PharmD Clinical Pharmacist  04/01/2020,5:35 AM

## 2020-04-01 NOTE — Care Management (Signed)
CM was forwarded a call by unit.  Caller identified herself as; Dois Davenport a NP associated with [employer] [phone]).  Ms Cliffton Asters informed CM that she makes visits to pt's  home as a Consulting civil engineer.  NP informed CM that pt lives alone and has a sister in law Everlena Cooper that assist pt' some however NP raised concerns that pt does not have 24/7 care/asssitance/support.  NP recommends residential hospice.  CM at no time gave any information on pt.   CM requested consent to release documentation - NP informed CM that she will reach out to pt's family for consent.  CM explained that HIPAA prohibits release of information without a signed consent and ended phone call   Attending made aware of discussion with NP.   Per NP Dois Davenport: Pt's son is Niyana Chesbro [phone] Pt's sister in law Deary McEntire [phone]

## 2020-04-01 NOTE — Progress Notes (Signed)
ANTICOAGULATION CONSULT NOTE - Follow Up Consult  Pharmacy Consult for Heparin Indication: atrial fibrillation  Allergies  Allergen Reactions  . Codeine Shortness Of Breath and Palpitations  . Albuterol Other (See Comments)    tremor  . Bupropion Other (See Comments)    unknown  . Calcitonin ([place]) Diarrhea  . Penicillins Hives    Patient Measurements: Height: 5\' 3"  (160 cm) Weight: 62.6 kg (138 lb 0.1 oz) IBW/kg (Calculated) : 52.4 Heparin Dosing Weight:   Vital Signs: Temp: 97.7 F (36.5 C) (05/19 1600) Temp Source: Oral (05/19 1600) BP: 101/56 (05/19 2126) Pulse Rate: 82 (05/19 2126)  Labs: Recent Labs    03/31/20 1743 04/01/20 0313 04/01/20 1314 04/01/20 2114  HGB 12.3 11.1*  --   --   HCT 38.1 34.0*  --   --   PLT 149* 135*  --   --   APTT  --  121* 42* 77*  HEPARINUNFRC  --  >2.20*  --   --   CREATININE 1.19*  --   --   --   TROPONINIHS 2,574*  --   --   --     Estimated Creatinine Clearance: 27.6 mL/min (A) (by C-G formula based on SCr of 1.19 mg/dL (H)).   Assessment:  Anticoag:  on Eliquis 5 mg bid PTA, h/o Afib, LD taken pta 5/18. Hep gtt for ACS , no further CP per cards, rec'ds  heparin for 48-72 hr then back to eliquis for afib.   -Thrombocytopenia. plts 149>135 ,  Hgb 12.3>11.1 - aPTT 77 now in goal range.  Goal of Therapy:  aPTT 66-102 seconds Monitor platelets by anticoagulation protocol: Yes   Plan:  Con't heparin to 800 units/hr,  Daily aPTT, HL, CBC F/u for change back to pta Elquis   Megan Hurst. 6/18, PharmD, BCPS Clinical Staff Pharmacist [email or web address] Megan Hurst, Megan Hurst 04/01/2020,9:56 PM

## 2020-04-01 NOTE — Progress Notes (Signed)
PROGRESS NOTE                                                                                                                                                                                                             Patient Demographics:    Megan Hurst, is a 86 y.o. female, DOB - 03/04/1930, BJS:[id number]  Admit date - 03/31/2020   Admitting Physician Eduard Clos, MD  Outpatient Primary MD for the patient is [name], MD  LOS - 1   Chief Complaint  Patient presents with  . Shortness of Breath       Brief Narrative    Megan Hurst is a 86 y.o. female with history of COPD was recently admitted at [facility] for COPD exacerbation and patient had stayed at the hospital for almost 17 days discharged home yesterday was found to be increasingly weak short of breath by patient's home health aide and was referred to the ER.  Patient denies any chest pain but has been feeling weak and more short of breath.  Denies any abdominal pain nausea vomiting productive cough fever chills, In the ER patient's EKG shows acute ST elevation MI in the inferior leads chest x-ray unremarkable on-call cardiologist Dr. Excell Seltzer was consulted.  At this time patient and patient's son has requested no aggressive measures and the plan is to keep patient comfortable.  Patient has become more progressively short of breath and hypoxic.  Labs do show troponin of 2500 with BNP of 873.  Covid test was negative WBC was 18.80 creatinine 1.1.   Subjective:    Megan Hurst today denies any chest pain, but reports some dyspnea, and generalized weakness and fatigue.    Assessment  & Plan :    Active Problems:   Essential hypertension   COPD GOLD II    Acute ST elevation myocardial infarction (STEMI) of inferior wall (HCC)   Acute respiratory failure with hypoxia (HCC)   A-fib (HCC)   ARF (acute renal failure) (HCC)  Acute STEMI/inferior wall MI -Management per  cardiology, patient does not want anything done further besides conservative management, so recommendation is to continue with IV heparin drip for another 24 hours then switch to Eliquis twice daily. -She is DNR.  CKD -Plan, continue to monitor.  Severe COPD -Oxygen dependent, continue with home medication.  Acute on chronic hypoxic respiratory failure -He is currently on 15 L high flow nasal cannula, reports at baseline she is on 4 to 5 L of oxygen, this is most likely in the setting of her MI.  Goals of care: Per discussion with the patient, she is clear about she does not want any active intervention, and she wants to focus on hospice on comfort, so palliative medicine has been consulted.   COVID-19 Labs  No results for input(s): DDIMER, FERRITIN, LDH, CRP in the last 72 hours.  Lab Results  Component Value Date   [place] NEGATIVE 03/31/2020     Code Status : DNR  Family Communication  : none at bedside  Disposition Plan  :  Status is: Inpatient  Remains inpatient appropriate because:IV treatments appropriate due to intensity of illness or inability to take PO   Dispo: The patient is from: Home              Anticipated d/c is to: ??              Anticipated d/c date is: 2 days              Patient currently is not medically stable to d/c.         Consults  :  Cardiolgoy  Procedures  : none  DVT Prophylaxis  :  On heparin GTT  Lab Results  Component Value Date   PLT 135 (L) 04/01/2020   Antibiotics  :    Anti-infectives (From admission, onward)   None        Objective:   Vitals:   04/01/20 0035 04/01/20 0421 04/01/20 0815 04/01/20 1315  BP:  110/71 110/64 (!) 110/56  Pulse:  76  90  Resp:  18 19 18   Temp:  98 F (36.7 C) 98.5 F (36.9 C) 97.6 F (36.4 C)  TempSrc:   Oral Oral  SpO2: 91% 95% (!) 87% (!) 88%  Weight:  62.6 kg    Height:  5\' 3"  (1.6 m)      Wt Readings from Last 3 Encounters:  04/01/20 62.6 kg  08/07/15 67 kg    07/24/15 69.5 kg     Intake/Output Summary (Last 24 hours) at 04/01/2020 1525 Last data filed at 04/01/2020 1500 Gross per 24 hour  Intake 518.88 ml  Output --  Net 518.88 ml     Physical Exam  Awake Alert, Oriented X 3, with some increased work of breathing Symmetrical Chest wall movement, diminished air entry at the bases with some tachypnea RRR,No Gallops,Rubs or new Murmurs, No Parasternal Heave +ve B.Sounds, Abd Soft, No tenderness, No rebound - guarding or rigidity. No Cyanosis, +1 lower extremity edema bilaterally    Data Review:    CBC Recent Labs  Lab 03/31/20 1743 04/01/20 0313  WBC 18.8* 14.2*  HGB 12.3 11.1*  HCT 38.1 34.0*  PLT 149* 135*  MCV 96.5 95.2  MCH 31.1 31.1  MCHC 32.3 32.6  RDW 14.6 14.8  LYMPHSABS 1.1  --   MONOABS 0.8  --   EOSABS 0.1  --   BASOSABS 0.0  --     Chemistries  Recent Labs  Lab 03/31/20 1743  NA 135  K 3.9  CL 101  CO2 22  GLUCOSE 124*  BUN 26*  CREATININE 1.19*  CALCIUM 9.3  AST 35  ALT 24  ALKPHOS 78  BILITOT 1.1   ------------------------------------------------------------------------------------------------------------------ No results for input(s): CHOL, HDL, LDLCALC, TRIG, CHOLHDL, LDLDIRECT in the  last 72 hours.  No results found for: HGBA1C ------------------------------------------------------------------------------------------------------------------ No results for input(s): TSH, T4TOTAL, T3FREE, THYROIDAB in the last 72 hours.  Invalid input(s): FREET3 ------------------------------------------------------------------------------------------------------------------ No results for input(s): VITAMINB12, FOLATE, FERRITIN, TIBC, IRON, RETICCTPCT in the last 72 hours.  Coagulation profile No results for input(s): INR, PROTIME in the last 168 hours.  No results for input(s): DDIMER in the last 72 hours.  Cardiac Enzymes No results for input(s): CKMB, TROPONINI, MYOGLOBIN in the last 168  hours.  Invalid input(s): CK ------------------------------------------------------------------------------------------------------------------    Component Value Date/Time   BNP 873.0 (H) 03/31/2020 1747    Inpatient Medications  Scheduled Meds: . [START ON 04/07/2020] aspirin  81 mg Oral Weekly  . fluticasone furoate-vilanterol  1 puff Inhalation Daily  . ipratropium-albuterol  3 mL Nebulization BID  . metoprolol tartrate  25 mg Oral BID  . mirtazapine  7.5 mg Oral QHS  . nystatin   Topical BID  . pantoprazole  40 mg Oral Daily   Continuous Infusions: . heparin 800 Units/hr (04/01/20 1455)   PRN Meds:.morphine injection, ondansetron **OR** ondansetron (ZOFRAN) IV  Micro Results Recent Results (from the past 240 hour(s))  SARS Coronavirus 2 by RT PCR (hospital order, performed in [facility] hospital lab) Nasopharyngeal Nasopharyngeal Swab     Status: None   Collection Time: 03/31/20  7:14 PM   Specimen: Nasopharyngeal Swab  Result Value Ref Range Status   SARS Coronavirus 2 NEGATIVE NEGATIVE Final    Comment: (NOTE) SARS-CoV-2 target nucleic acids are NOT DETECTED. The SARS-CoV-2 RNA is generally detectable in upper and lower respiratory specimens during the acute phase of infection. The lowest concentration of SARS-CoV-2 viral copies this assay can detect is 250 copies / mL. A negative result does not preclude SARS-CoV-2 infection and should not be used as the sole basis for treatment or other patient management decisions.  A negative result may occur with improper specimen collection / handling, submission of specimen other than nasopharyngeal swab, presence of viral mutation(s) within the areas targeted by this assay, and inadequate number of viral copies (<250 copies / mL). A negative result must be combined with clinical observations, patient history, and epidemiological information. Fact Sheet for Patients:   BoilerBrush.com.cy Fact Sheet  for Healthcare Providers: https://pope.com/ This test is not yet approved or cleared  by the [place] FDA and has been authorized for detection and/or diagnosis of SARS-CoV-2 by FDA under an Emergency Use Authorization (EUA).  This EUA will remain in effect (meaning this test can be used) for the duration of the COVID-19 declaration under Section 564(b)(1) of the Act, 21 U.S.C. section 360bbb-3(b)(1), unless the authorization is terminated or revoked sooner. Performed at [facility], [address] [place], [place] [address]     Radiology Reports DG Chest Portable 1 View  Result Date: 03/31/2020 CLINICAL DATA:  Shortness of breath. Hypoxia. Congestive heart failure. EXAM: PORTABLE CHEST 1 VIEW COMPARISON:  03/25/2020 FINDINGS: Stable cardiomegaly. Aortic atherosclerosis noted. Stable scarring in both lung bases and right upper lobe. No evidence of acute infiltrate or pleural effusion. IMPRESSION: Stable cardiomegaly, and bibasilar and right upper lobe scarring. No acute findings. Electronically Signed   By: Danae Orleans M.D.   On: 03/31/2020 18:34     Huey Bienenstock M.D on 04/01/2020 at 3:25 PM  Between 7am to 7pm - Pager - [phone]  After 7pm go to [email or web address] - password [facility]  [facility] -  Office  [phone]

## 2020-04-01 NOTE — Progress Notes (Addendum)
Progress Note  Patient Name: Megan Hurst Date of Encounter: 04/01/2020  Primary Cardiologist: No primary care provider on file. Dr. Burt Knack   Subjective   No chest pain and no SOB, sitting up in bed having BK.    Inpatient Medications    Scheduled Meds: . [START ON 04/07/2020] aspirin  81 mg Oral Weekly  . fluticasone furoate-vilanterol  1 puff Inhalation Daily  . ipratropium-albuterol  3 mL Nebulization QID  . metoprolol tartrate  25 mg Oral BID  . mirtazapine  7.5 mg Oral QHS  . nystatin   Topical BID  . pantoprazole  40 mg Oral Daily   Continuous Infusions: . heparin 700 Units/hr (04/01/20 0636)   PRN Meds: morphine injection, ondansetron **OR** ondansetron (ZOFRAN) IV   Vital Signs    Vitals:   04/01/20 0034 04/01/20 0035 04/01/20 0421 04/01/20 0815  BP: 104/60  110/71 110/64  Pulse: 94  76   Resp: 18  18 19   Temp: 97.8 F (36.6 C)  98 F (36.7 C) 98.5 F (36.9 C)  TempSrc: Oral   Oral  SpO2: (!) 86% 91% 95% (!) 87%  Weight:   62.6 kg   Height:   5\' 3"  (1.6 m)     Intake/Output Summary (Last 24 hours) at 04/01/2020 0959 Last data filed at 03/31/2020 2023 Gross per 24 hour  Intake 100 ml  Output -  Net 100 ml   Last 3 Weights 04/01/2020 03/31/2020 08/07/2015  Weight (lbs) 138 lb 0.1 oz 147 lb 11.3 oz 147 lb 12.8 oz  Weight (kg) 62.6 kg 67 kg 67.042 kg      Telemetry    none - Personally Reviewed  ECG    No new - Personally Reviewed  Physical Exam   GEN: No acute distress.   Neck: No JVD Cardiac: irreg but rate controlled, no murmurs, rubs, or gallops.  Respiratory: diminished to auscultation bilaterally. GI: Soft, nontender, non-distended  MS: 1+ edema of lower ext ; No deformity. Neuro:  Nonfocal  Psych: Normal affect   Labs    High Sensitivity Troponin:   Recent Labs  Lab 03/31/20 1743  TROPONINIHS 2,574*      Chemistry Recent Labs  Lab 03/31/20 1743  NA 135  K 3.9  CL 101  CO2 22  GLUCOSE 124*  BUN 26*  CREATININE 1.19*   CALCIUM 9.3  PROT 5.7*  ALBUMIN 3.0*  AST 35  ALT 24  ALKPHOS 78  BILITOT 1.1  GFRNONAA 41*  GFRAA 48*  ANIONGAP 12     Hematology Recent Labs  Lab 03/31/20 1743 04/01/20 0313  WBC 18.8* 14.2*  RBC 3.95 3.57*  HGB 12.3 11.1*  HCT 38.1 34.0*  MCV 96.5 95.2  MCH 31.1 31.1  MCHC 32.3 32.6  RDW 14.6 14.8  PLT 149* 135*    BNP Recent Labs  Lab 03/31/20 1747  BNP 873.0*     DDimer No results for input(s): DDIMER in the last 168 hours.   Radiology    DG Chest Portable 1 View  Result Date: 03/31/2020 CLINICAL DATA:  Shortness of breath. Hypoxia. Congestive heart failure. EXAM: PORTABLE CHEST 1 VIEW COMPARISON:  03/25/2020 FINDINGS: Stable cardiomegaly. Aortic atherosclerosis noted. Stable scarring in both lung bases and right upper lobe. No evidence of acute infiltrate or pleural effusion. IMPRESSION: Stable cardiomegaly, and bibasilar and right upper lobe scarring. No acute findings. Electronically Signed   By: Marlaine Hind M.D.   On: 03/31/2020 18:34    Cardiac Studies  none  Patient Profile     86 y.o. female with a hx of severe COPD on home 02, hx PAF on eliquis who is being seen today for the evaluation of STEMI but admitted for hypoxia with 02.  EKG with a fib and inf ST elevation and reciprocal changes.  Pt is a DNR and was chest pain free.  We recommended troponin check and hospice/pallitavie care consult.  Dr. Excell Seltzer talked with pt and son.   Assessment & Plan    Acute ST elevation MI with troponin 2574 -pt and son wanted no further treatment  On heparin  For 48 to 72 hours, then back to eliquis for atrial fib.  Hgb stable.    Acute CHF with respiratory failure due to MI with BNP 873  Plan to keep pt comfortable.  CXR stable.   Chronic respiratory failure with COPD and home 02.  Per IM  Atrial fib on eliquis, would continue once off heparin  AKI with Cr 1.19 - she prefers no further blood draws  Thrombocytopenia. plts 135   Pt is a DNR.             For questions or updates, please contact [facility] Please consult [email or web address] for contact info under        Signed, Nada Boozer, NP  04/01/2020, 9:59 AM     Attending Note:   The patient was seen and examined.  Agree with assessment and plan as noted above.  Changes made to the above note as needed.  Patient seen and independently examined with  Nada Boozer, NP .   We discussed all aspects of the encounter. I agree with the assessment and plan as stated above.  1.   Acute Inferior wall MI:   Pt is pain free at this point .  She does not want anything done further.  The plan is for IV heparin for another day and then change back to Eliquis 5 BID  She is a DNR.    2.  CKD  3.  COPD:  Severe, home O2 dependent     I have spent a total of 40 minutes with patient reviewing hospital  notes , telemetry, EKGs, labs and examining patient as well as establishing an assessment and plan that was discussed with the patient. > 50% of time was spent in direct patient care.  [facility] will sign off.   Medication Recommendations:  Cont supportive / comfort care.  She does not want any thing further from a cardiology standpont  Other recommendations (labs, testing, etc):   Follow up as an outpatient:  With her primary MD     Vesta Mixer, Montez Hageman., MD, [facility] 04/01/2020, 11:18 AM [address],  [address] Office [phone] Pager [phone]

## 2020-04-02 DIAGNOSIS — Z66 Do not resuscitate: Secondary | ICD-10-CM

## 2020-04-02 DIAGNOSIS — R0609 Other forms of dyspnea: Secondary | ICD-10-CM

## 2020-04-02 DIAGNOSIS — Z515 Encounter for palliative care: Secondary | ICD-10-CM

## 2020-04-02 LAB — CBC
HCT: 32.1 % — ABNORMAL LOW (ref 36.0–46.0)
Hemoglobin: 10.5 g/dL — ABNORMAL LOW (ref 12.0–15.0)
MCH: 31.2 pg (ref 26.0–34.0)
MCHC: 32.7 g/dL (ref 30.0–36.0)
MCV: 95.3 fL (ref 80.0–100.0)
Platelets: 128 10*3/uL — ABNORMAL LOW (ref 150–400)
RBC: 3.37 MIL/uL — ABNORMAL LOW (ref 3.87–5.11)
RDW: 14.9 % (ref 11.5–15.5)
WBC: 10.8 10*3/uL — ABNORMAL HIGH (ref 4.0–10.5)
nRBC: 0 % (ref 0.0–0.2)

## 2020-04-02 LAB — HEPARIN LEVEL (UNFRACTIONATED): Heparin Unfractionated: 1.66 IU/mL — ABNORMAL HIGH (ref 0.30–0.70)

## 2020-04-02 LAB — APTT: aPTT: 30 seconds (ref 24–36)

## 2020-04-02 MED ORDER — GLYCOPYRROLATE 1 MG PO TABS: 1.0000 mg | ORAL_TABLET | ORAL | Status: DC | PRN

## 2020-04-02 MED ORDER — POLYVINYL ALCOHOL 1.4 % OP SOLN
1.0000 [drp] | Freq: Four times a day (QID) | OPHTHALMIC | Status: DC | PRN
  Filled 2020-04-02: qty 15

## 2020-04-02 MED ORDER — ONDANSETRON 4 MG PO TBDP: 4.0000 mg | ORAL_TABLET | Freq: Four times a day (QID) | ORAL | Status: DC | PRN

## 2020-04-02 MED ORDER — LORAZEPAM 2 MG/ML PO CONC: 1.0000 mg | ORAL | Status: DC | PRN

## 2020-04-02 MED ORDER — POLYETHYLENE GLYCOL 3350 17 G PO PACK
17.0000 g | PACK | Freq: Two times a day (BID) | ORAL | Status: AC
  Administered 2020-04-02 – 2020-04-03 (×2): 17 g via ORAL
  Filled 2020-04-02 (×3): qty 1

## 2020-04-02 MED ORDER — BISACODYL 5 MG PO TBEC
10.0000 mg | DELAYED_RELEASE_TABLET | Freq: Once | ORAL | Status: AC
  Administered 2020-04-02: 10 mg via ORAL
  Filled 2020-04-02: qty 2

## 2020-04-02 MED ORDER — ACETAMINOPHEN 325 MG PO TABS: 650.0000 mg | ORAL_TABLET | Freq: Four times a day (QID) | ORAL | Status: DC | PRN

## 2020-04-02 MED ORDER — SENNOSIDES-DOCUSATE SODIUM 8.6-50 MG PO TABS
1.0000 | ORAL_TABLET | Freq: Two times a day (BID) | ORAL | Status: DC
  Administered 2020-04-02 – 2020-04-04 (×6): 1 via ORAL
  Filled 2020-04-02 (×6): qty 1

## 2020-04-02 MED ORDER — LORAZEPAM 2 MG/ML IJ SOLN: 1.0000 mg | INTRAMUSCULAR | Status: DC | PRN

## 2020-04-02 MED ORDER — MORPHINE SULFATE (CONCENTRATE) 10 MG/0.5ML PO SOLN
5.0000 mg | ORAL | Status: DC | PRN
  Administered 2020-04-04: 5 mg via ORAL
  Filled 2020-04-02: qty 0.5

## 2020-04-02 MED ORDER — GLYCOPYRROLATE 0.2 MG/ML IJ SOLN: 0.2000 mg | INTRAMUSCULAR | Status: DC | PRN

## 2020-04-02 MED ORDER — BIOTENE DRY MOUTH MT LIQD: 15.0000 mL | OROMUCOSAL | Status: DC | PRN

## 2020-04-02 MED ORDER — MORPHINE SULFATE (CONCENTRATE) 10 MG/0.5ML PO SOLN
5.0000 mg | Freq: Three times a day (TID) | ORAL | Status: DC
  Administered 2020-04-02 – 2020-04-04 (×6): 5 mg via ORAL
  Filled 2020-04-02 (×6): qty 0.5

## 2020-04-02 MED ORDER — ONDANSETRON HCL 4 MG/2ML IJ SOLN: 4.0000 mg | Freq: Four times a day (QID) | INTRAMUSCULAR | Status: DC | PRN

## 2020-04-02 MED ORDER — MORPHINE SULFATE (CONCENTRATE) 10 MG/0.5ML PO SOLN: 5.0000 mg | ORAL | Status: DC | PRN

## 2020-04-02 MED ORDER — ACETAMINOPHEN 650 MG RE SUPP: 650.0000 mg | Freq: Four times a day (QID) | RECTAL | Status: DC | PRN

## 2020-04-02 MED ORDER — LORAZEPAM 1 MG PO TABS: 1.0000 mg | ORAL_TABLET | ORAL | Status: DC | PRN

## 2020-04-02 NOTE — Progress Notes (Signed)
Daily Progress Note   Patient Name: Megan Hurst       Date: 04/02/2020 DOB: 12/29/1929  Age: 86 y.o. MRN#: [id number] Attending Physician: Albertine Patricia, MD Primary Care Physician: Maris Berger, MD Admit Date: 03/31/2020  Reason for Consultation/Follow-up: Establishing goals of care and Terminal Care  HPI/Patient Profile: 86 y.o. female  with past medical history of severe COPD on home O2,  admitted on 03/31/2020 with increasing weakness and shortness of breath at home.  She was recently admitted at [facility] for COPD exacerbation and hospitalized for almost 17 days discharged home for 1 day, not doing well and presented to the ED. In the ER patient's EKG shows acute ST elevation MI.  Per hospitalist notes, patient and patient's son has requested no aggressive measures and the plan is to keep patient comfortable. Patient has become more progressively short of breath and hypoxic.   Subjective: Met with patient and son Megan Hurst at the bedside. Patient reports shortness of breath and also some diminished appetite. We again discussed her current illness and what it means in the larger context of her on-going co-morbidities. Natural disease trajectory and expectations at EOL were discussed. Patient and son clearly understand that patient has end-stage COPD and now has suffered a large MI. They are both peace with the fact she is at the end of her life.   Advanced directives, concepts specific to code status, artifical feeding and hydration, and rehospitalization were reviewed to make sure both patient and son fully understand the meaning of comfort care.   Hospice services were explained and offered. Patient and son prefer a facility in [facility].   Questions and concerns were  addressed.  The family was encouraged to call with questions or concerns.   Length of Stay: 2  Current Medications: Scheduled Meds:  . fluticasone furoate-vilanterol  1 puff Inhalation Daily  . ipratropium-albuterol  3 mL Nebulization BID  . mirtazapine  7.5 mg Oral QHS  . nystatin   Topical BID     PRN Meds: acetaminophen **OR** acetaminophen, antiseptic oral rinse, glycopyrrolate **OR** glycopyrrolate **OR** glycopyrrolate, LORazepam **OR** LORazepam **OR** LORazepam, morphine injection, morphine CONCENTRATE **OR** morphine CONCENTRATE, ondansetron **OR** ondansetron (ZOFRAN) IV, ondansetron **OR** ondansetron (ZOFRAN) IV, polyvinyl alcohol  Physical Exam Vitals reviewed.  Constitutional:      Appearance:  She is ill-appearing.     Comments: Appears frail  HENT:     Head: Normocephalic and atraumatic.  Pulmonary:     Effort: Tachypnea present.     Comments: Labored breathing Neurological:     Mental Status: She is alert and oriented to person, place, and time.             Vital Signs: BP (!) 116/55   Pulse 93   Temp (!) 97.5 F (36.4 C) (Oral)   Resp 18   Ht _0  (1.6 m)   Wt 62.6 kg   SpO2 94%   BMI 24.45 kg/m  SpO2: SpO2: 94 % O2 Device: O2 Device: High Flow Nasal Cannula O2 Flow Rate: O2 Flow Rate (L/min): (S) 10 L/min  Intake/output summary:   Intake/Output Summary (Last 24 hours) at 04/02/2020 1046 Last data filed at 04/01/2020 1834 Gross per 24 hour  Intake 268.88 ml  Output 850 ml  Net -581.12 ml   LBM: Last BM Date: 03/30/20 Baseline Weight: Weight: 67 kg Most recent weight: Weight: 62.6 kg       Palliative Assessment/Data: 30%     Patient Active Problem List   Diagnosis Date Noted  . Palliative care by specialist   . DNR (do not resuscitate)   . Acute MI ([place]) 03/31/2020  . Acute respiratory failure with hypoxia ([place]) 03/31/2020  . A-fib ([place]) 03/31/2020  . ARF (acute renal failure) ([place]) 03/31/2020  . Acute ST elevation myocardial  infarction (STEMI) of inferior wall (HCC)   . COPD GOLD II  07/25/2015  . Essential hypertension 06/27/2015  . Solitary pulmonary nodule 06/11/2015  . Pulmonary infiltrates new since plain cxr 04/15/15  06/11/2015  . Upper airway cough syndrome 06/11/2015  . Valvular heart disease 06/10/2015    Palliative Care Assessment & Plan   Assessment: Patient with end stage COPD and acute STEMI, does not want to pursue additional medical interventions. Transitioning to full comfort care.   Recommendations/Plan:  Symptom management per EOL order set  [facility] order for residential hospice referral  For management of dyspnea, will start morphine concentrate solution (roxinol) 5 mg every 8 hours   Goals of Care and Additional Recommendations:  Limitations on Scope of Treatment: Full Comfort Care  Code Status:  DNR  Prognosis:   < 2 weeks  Discharge Planning:  Hospice facility  Care plan was discussed with bedside RN, CSW, Dr. Waldron Labs.  Thank you for allowing the Palliative Medicine Team to assist in the care of this patient.   Total Time 35 minutes Prolonged Time Billed  no       Greater than 50%  of this time was spent counseling and coordinating care related to the above assessment and plan.  This NP Wadie Lessen was present and agree with above assessment and plan   Lavena Bullion, NP  Please contact Palliative Medicine Team phone at [phone] for questions and concerns.

## 2020-04-02 NOTE — Progress Notes (Signed)
Rounding completed at this time, pt appears very tired and stated "I am just completely exhausted, I am ready to go home and be done with this" pt requested to have PM meds at this time so she could rest, meds given, see MAR. Telemetry and O2 monitor discontinued at this time as ordered. No other needs, will CTM.

## 2020-04-02 NOTE — Progress Notes (Signed)
PROGRESS NOTE                                                                                                                                                                                                             Patient Demographics:    Megan Hurst, is a 86 y.o. female, DOB - May 28, 1930, LYY:[id number]  Admit date - 03/31/2020   Admitting Physician Eduard Clos, MD  Outpatient Primary MD for the patient is [name], MD  LOS - 2   Chief Complaint  Patient presents with  . Shortness of Breath       Brief Narrative    Megan Hurst is a 86 y.o. female with history of COPD was recently admitted at [facility] for COPD exacerbation and patient had stayed at the hospital for almost 17 days discharged home yesterday was found to be increasingly weak short of breath by patient's home health aide and was referred to the ER.  Patient denies any chest pain but has been feeling weak and more short of breath.  Denies any abdominal pain nausea vomiting productive cough fever chills, In the ER patient's EKG shows acute ST elevation MI in the inferior leads chest x-ray unremarkable on-call cardiologist Dr. Excell Seltzer was consulted.  At this time patient and patient's son has requested no aggressive measures and the plan is to keep patient comfortable.  Patient has become more progressively short of breath and hypoxic.  Labs do show troponin of 2500 with BNP of 873.  Covid test was negative WBC was 18.80 creatinine 1.1.   Subjective:    Megan Hurst today denies any chest pain, she reports dyspnea at baseline, report generalized weakness and fatigue .    Assessment  & Plan :    Active Problems:   Essential hypertension   COPD GOLD II    Acute ST elevation myocardial infarction (STEMI) of inferior wall (HCC)   Acute respiratory failure with hypoxia (HCC)   A-fib (HCC)   ARF (acute renal failure) (HCC)   Palliative care by specialist   DNR  (do not resuscitate)  Acute STEMI/inferior wall MI -Patient and family very clear from admission, they do not wish for any invasive measures, they wanted conservative management, with goals of mind to proceed with comfort. -Urology input greatly appreciated, given family and patient wishes, she  was treated with heparin GTT for 24 to 48 hours, currently she is off heparin drip. -Patient is currently comfort measures, no further work-up at this point.  CKD -At baseline, no intervention needed  Severe COPD -O she is oxygen dependent, appears to be end-stage, with significant oxygen requirement currently at 15 L nasal cannula.  Acute on chronic hypoxic respiratory failure -He is currently on 15 L high flow nasal cannula, reports at baseline she is on 4 to 5 L of oxygen, this is most likely in the setting of her MI.  Atrial fibrillation -He is currently comfort, no need for anticoagulation  Goals of care: Dilated consult input greatly appreciated, at this point goal is mainly for comfort, and plan for residential hospice at [facility], which has been consulted.  Does report some constipation, for which she has been started on laxatives  COVID-19 Labs  No results for input(s): DDIMER, FERRITIN, LDH, CRP in the last 72 hours.  Lab Results  Component Value Date   SARSCOV2NAA NEGATIVE 03/31/2020     Code Status : DNR  Family Communication  : none at bedside  Disposition Plan  :  Status is: Inpatient  Remains inpatient appropriate because:IV treatments appropriate due to intensity of illness or inability to take PO   Dispo: The patient is from: Home              Anticipated d/c is to: Residential hospice?              Anticipated d/c date is: 2 days              Patient currently is not medically stable to d/c.         Consults  :  Cardiolgoy  Procedures  : none  DVT Prophylaxis  :   heparin GTT stopped  Lab Results  Component Value Date   PLT 128 (L) 04/02/2020    Antibiotics  :    Anti-infectives (From admission, onward)   None        Objective:   Vitals:   04/02/20 0407 04/02/20 0738 04/02/20 0800 04/02/20 0912  BP:  (!) 99/50 (!) 116/55   Pulse:  (!) 112 93   Resp:  19  18  Temp: 97.6 F (36.4 C) (!) 97.5 F (36.4 C)    TempSrc: Axillary Oral    SpO2:  98% 94%   Weight:      Height:        Wt Readings from Last 3 Encounters:  04/01/20 62.6 kg  08/07/15 67 kg  07/24/15 69.5 kg     Intake/Output Summary (Last 24 hours) at 04/02/2020 1209 Last data filed at 04/02/2020 0842 Gross per 24 hour  Intake 388.88 ml  Output 850 ml  Net -461.12 ml     Physical Exam  Awake Alert, Oriented X 3, mildly tachypneic  \symmetrical Chest wall movement, managed air entry at the bases with rhonchi RRR,No Gallops,Rubs or new Murmurs, No Parasternal Heave +ve B.Sounds, Abd Soft, No tenderness, No rebound - guarding or rigidity. No Cyanosis, Clubbing or edema, No new Rash or bruise      Data Review:    CBC Recent Labs  Lab 03/31/20 1743 04/01/20 0313 04/02/20 0443  WBC 18.8* 14.2* 10.8*  HGB 12.3 11.1* 10.5*  HCT 38.1 34.0* 32.1*  PLT 149* 135* 128*  MCV 96.5 95.2 95.3  MCH 31.1 31.1 31.2  MCHC 32.3 32.6 32.7  RDW 14.6 14.8 14.9  LYMPHSABS 1.1  --   --  MONOABS 0.8  --   --   EOSABS 0.1  --   --   BASOSABS 0.0  --   --     Chemistries  Recent Labs  Lab 03/31/20 1743  NA 135  K 3.9  CL 101  CO2 22  GLUCOSE 124*  BUN 26*  CREATININE 1.19*  CALCIUM 9.3  AST 35  ALT 24  ALKPHOS 78  BILITOT 1.1   ------------------------------------------------------------------------------------------------------------------ No results for input(s): CHOL, HDL, LDLCALC, TRIG, CHOLHDL, LDLDIRECT in the last 72 hours.  No results found for: HGBA1C ------------------------------------------------------------------------------------------------------------------ No results for input(s): TSH, T4TOTAL, T3FREE, THYROIDAB in the  last 72 hours.  Invalid input(s): FREET3 ------------------------------------------------------------------------------------------------------------------ No results for input(s): VITAMINB12, FOLATE, FERRITIN, TIBC, IRON, RETICCTPCT in the last 72 hours.  Coagulation profile No results for input(s): INR, PROTIME in the last 168 hours.  No results for input(s): DDIMER in the last 72 hours.  Cardiac Enzymes No results for input(s): CKMB, TROPONINI, MYOGLOBIN in the last 168 hours.  Invalid input(s): CK ------------------------------------------------------------------------------------------------------------------    Component Value Date/Time   BNP 873.0 (H) 03/31/2020 1747    Inpatient Medications  Scheduled Meds: . bisacodyl  10 mg Oral Once  . fluticasone furoate-vilanterol  1 puff Inhalation Daily  . ipratropium-albuterol  3 mL Nebulization BID  . mirtazapine  7.5 mg Oral QHS  . nystatin   Topical BID  . polyethylene glycol  17 g Oral BID   Continuous Infusions:  PRN Meds:.acetaminophen **OR** acetaminophen, antiseptic oral rinse, glycopyrrolate **OR** glycopyrrolate **OR** glycopyrrolate, LORazepam **OR** LORazepam **OR** LORazepam, morphine injection, morphine CONCENTRATE **OR** morphine CONCENTRATE, ondansetron **OR** ondansetron (ZOFRAN) IV, polyvinyl alcohol  Micro Results Recent Results (from the past 240 hour(s))  SARS Coronavirus 2 by RT PCR (hospital order, performed in [place] hospital lab) Nasopharyngeal Nasopharyngeal Swab     Status: None   Collection Time: 03/31/20  7:14 PM   Specimen: Nasopharyngeal Swab  Result Value Ref Range Status   SARS Coronavirus 2 NEGATIVE NEGATIVE Final    Comment: (NOTE) SARS-CoV-2 target nucleic acids are NOT DETECTED. The SARS-CoV-2 RNA is generally detectable in upper and lower respiratory specimens during the acute phase of infection. The lowest concentration of SARS-CoV-2 viral copies this assay can detect is  250 copies / mL. A negative result does not preclude SARS-CoV-2 infection and should not be used as the sole basis for treatment or other patient management decisions.  A negative result may occur with improper specimen collection / handling, submission of specimen other than nasopharyngeal swab, presence of viral mutation(s) within the areas targeted by this assay, and inadequate number of viral copies (<250 copies / mL). A negative result must be combined with clinical observations, patient history, and epidemiological information. Fact Sheet for Patients:   StrictlyIdeas.no Fact Sheet for Healthcare Providers: BankingDealers.co.za This test is not yet approved or cleared  by the [place] FDA and has been authorized for detection and/or diagnosis of SARS-CoV-2 by FDA under an Emergency Use Authorization (EUA).  This EUA will remain in effect (meaning this test can be used) for the duration of the COVID-19 declaration under Section 564(b)(1) of the Act, 21 U.S.C. section 360bbb-3(b)(1), unless the authorization is terminated or revoked sooner. Performed at [facility], [place] [address], [place], [place] [address]     Radiology Reports DG Chest Portable 1 View  Result Date: 03/31/2020 CLINICAL DATA:  Shortness of breath. Hypoxia. Congestive heart failure. EXAM: PORTABLE CHEST 1 VIEW COMPARISON:  03/25/2020 FINDINGS: Stable cardiomegaly.  Aortic atherosclerosis noted. Stable scarring in both lung bases and right upper lobe. No evidence of acute infiltrate or pleural effusion. IMPRESSION: Stable cardiomegaly, and bibasilar and right upper lobe scarring. No acute findings. Electronically Signed   By: Danae Orleans M.D.   On: 03/31/2020 18:34     Huey Bienenstock M.D on 04/02/2020 at 12:09 PM  Between 7am to 7pm - Pager - [phone]  After 7pm go to [email or web address] - password [facility]  [facility] -  Office  [phone]

## 2020-04-02 NOTE — Progress Notes (Signed)
   04/02/20 0957  Clinical Encounter Type  Visited With Patient  Visit Type Initial;Spiritual support  Referral From Palliative care team  Consult/Referral To Chaplain  Spiritual Encounters  Spiritual Needs Prayer;Sacred text  Stress Factors  Patient Stress Factors None identified  This chaplain responded to PMT consult for spiritual care as Pt. begins transition to comfort care.  The chaplain witnessed the Pt. faith through her joyful spirit and hope in eternal life. The Pt. shared with the chaplain "she is tired of being poked and prodded".  The Pt clarified with the chaplain "she does not want to be in Hospice care 2-3 weeks, 2-3 days is her preference".  The chaplain encouraged the Pt. to share her wishes with the medical team. The chaplain received the Pt. appreciation of the spiritual care visit and prayer.  The Pt. shared with the chaplain she is waiting on a visit with PMT NP Amil Amen and the Pt. son to determine the next steps in her care.  F/U spiritual care is available as needed.

## 2020-04-02 NOTE — Progress Notes (Signed)
[facility]  Reviewed the chart and the pt has not needed any kind of symptom management needs other than the oxygen which she can get in a facility or back at home. We would like to see if she requires andy increased sx needs in next 24 hours to better qualify her for the [facility]. I have spoke to the pt's son about this and he is in agreement.   Norm Parcel RN [phone]

## 2020-04-02 NOTE — Progress Notes (Signed)
CSW received consult for residential hospice facility placement in [place]. CSW sent referral for review.   Luxe Cuadros LCSW

## 2020-04-02 NOTE — Progress Notes (Addendum)
[facility] MD has not yet approved patient and would like to check back on patient tomorrow to see if more symptom management is needed to qualify her.   Zackarey Holleman LCSW

## 2020-04-03 NOTE — Progress Notes (Signed)
CSW requested [facility] review patient's case to see if they would be able to accept her at [facility].   Nadia Rayyan LCSW

## 2020-04-03 NOTE — Progress Notes (Signed)
PROGRESS NOTE                                                                                                                                                                                                             Patient Demographics:    Megan Hurst, is a 86 y.o. female, DOB - 14-May-1930, MVE:[id number]  Admit date - 03/31/2020   Admitting Physician Eduard Clos, MD  Outpatient Primary MD for the patient is [name], MD  LOS - 3   Chief Complaint  Patient presents with  . Shortness of Breath       Brief Narrative    Megan Hurst is a 86 y.o. female with history of COPD was recently admitted at [facility] for COPD exacerbation and patient had stayed at the hospital for almost 17 days discharged home yesterday was found to be increasingly weak short of breath by patient's home health aide and was referred to the ER.  Patient denies any chest pain but has been feeling weak and more short of breath.  Denies any abdominal pain nausea vomiting productive cough fever chills, In the ER patient's EKG shows acute ST elevation MI in the inferior leads chest x-ray unremarkable on-call cardiologist Dr. Excell Seltzer was consulted.  At this time patient and patient's son has requested no aggressive measures and the plan is to keep patient comfortable.  Patient has become more progressively short of breath and hypoxic.  Labs do show troponin of 2500 with BNP of 873.  Covid test was negative WBC was 18.80 creatinine 1.1.   Subjective:    Megan Hurst today any chest pain, reports her dyspnea at baseline, reports some episodes of anxiety this morning secondary to dyspnea which has improved with receiving morphine.    Assessment  & Plan :    Active Problems:   Essential hypertension   COPD GOLD II    Acute ST elevation myocardial infarction (STEMI) of inferior wall (HCC)   Acute respiratory failure with hypoxia (HCC)   A-fib (HCC)   ARF (acute  renal failure) (HCC)   Palliative care by specialist   DNR (do not resuscitate)  Acute STEMI/inferior wall MI -Patient and family very clear from admission, they do not wish for any invasive measures, they wanted conservative management, with goals of mind to proceed with comfort. -Urology  input greatly appreciated, given family and patient wishes, she was treated with heparin GTT for 24 to 48 hours, currently she is off heparin drip. -Patient is currently comfort measures, no further work-up at this point.  CKD -At baseline, no intervention needed  Severe COPD -O she is oxygen dependent, appears to be end-stage, with significant oxygen requirement currently at 15 L nasal cannula.  Acute on chronic hypoxic respiratory failure -She was requiring 15 L on admission, she is currently tolerating 2 L nasal cannula .  Atrial fibrillation -He is currently comfort, no need for anticoagulation  Goals of care: Dilated consult input greatly appreciated, at this point goal is mainly for comfort, and plan for residential hospice at [facility], which has been consulted.  Does report some constipation, for which she has been started on laxatives  COVID-19 Labs  No results for input(s): DDIMER, FERRITIN, LDH, CRP in the last 72 hours.  Lab Results  Component Value Date   SARSCOV2NAA NEGATIVE 03/31/2020     Code Status : DNR  Family Communication  : none at bedside  Disposition Plan  :  Status is: Inpatient  Remains inpatient appropriate because:IV treatments appropriate due to intensity of illness or inability to take PO   Dispo: The patient is from: Home              Anticipated d/c is to: Residential hospice?  Once bed available              Anticipated d/c date is: 2 days              Patient currently is not medically stable to d/c.         Consults  :  Cardiolgoy  Procedures  : none  DVT Prophylaxis  :   heparin GTT stopped  Lab Results  Component Value Date   PLT  128 (L) 04/02/2020   Antibiotics  :    Anti-infectives (From admission, onward)   None        Objective:   Vitals:   04/02/20 0912 04/02/20 1942 04/03/20 0053 04/03/20 0756  BP:   (!) 108/58   Pulse:   94 (!) 106  Resp: 18  20 20   Temp:   98.2 F (36.8 C)   TempSrc:   Axillary   SpO2:  99% 98% 94%  Weight:      Height:        Wt Readings from Last 3 Encounters:  04/01/20 62.6 kg  08/07/15 67 kg  07/24/15 69.5 kg     Intake/Output Summary (Last 24 hours) at 04/03/2020 1604 Last data filed at 04/03/2020 0909 Gross per 24 hour  Intake 120 ml  Output 150 ml  Net -30 ml     Physical Exam  Awake alert oriented, some increased work of breathing, but overall appears comfortable \\symmetrical  Chest wall movement, managed air entry at the bases with rhonchi RRR,No Gallops,Rubs or new Murmurs, No Parasternal Heave [place] soft, nontender, nondistended No Cyanosis, Clubbing or edema, No new Rash or bruise      Data Review:    CBC Recent Labs  Lab 03/31/20 1743 04/01/20 0313 04/02/20 0443  WBC 18.8* 14.2* 10.8*  HGB 12.3 11.1* 10.5*  HCT 38.1 34.0* 32.1*  PLT 149* 135* 128*  MCV 96.5 95.2 95.3  MCH 31.1 31.1 31.2  MCHC 32.3 32.6 32.7  RDW 14.6 14.8 14.9  LYMPHSABS 1.1  --   --   MONOABS 0.8  --   --  EOSABS 0.1  --   --   BASOSABS 0.0  --   --     Chemistries  Recent Labs  Lab 03/31/20 1743  NA 135  K 3.9  CL 101  CO2 22  GLUCOSE 124*  BUN 26*  CREATININE 1.19*  CALCIUM 9.3  AST 35  ALT 24  ALKPHOS 78  BILITOT 1.1   ------------------------------------------------------------------------------------------------------------------ No results for input(s): CHOL, HDL, LDLCALC, TRIG, CHOLHDL, LDLDIRECT in the last 72 hours.  No results found for: HGBA1C ------------------------------------------------------------------------------------------------------------------ No results for input(s): TSH, T4TOTAL, T3FREE, THYROIDAB in the last 72  hours.  Invalid input(s): FREET3 ------------------------------------------------------------------------------------------------------------------ No results for input(s): VITAMINB12, FOLATE, FERRITIN, TIBC, IRON, RETICCTPCT in the last 72 hours.  Coagulation profile No results for input(s): INR, PROTIME in the last 168 hours.  No results for input(s): DDIMER in the last 72 hours.  Cardiac Enzymes No results for input(s): CKMB, TROPONINI, MYOGLOBIN in the last 168 hours.  Invalid input(s): CK ------------------------------------------------------------------------------------------------------------------    Component Value Date/Time   BNP 873.0 (H) 03/31/2020 1747    Inpatient Medications  Scheduled Meds: . fluticasone furoate-vilanterol  1 puff Inhalation Daily  . ipratropium-albuterol  3 mL Nebulization BID  . mirtazapine  7.5 mg Oral QHS  . morphine CONCENTRATE  5 mg Oral Q8H  . nystatin   Topical BID  . polyethylene glycol  17 g Oral BID  . senna-docusate  1 tablet Oral BID   Continuous Infusions:  PRN Meds:.acetaminophen **OR** acetaminophen, antiseptic oral rinse, glycopyrrolate **OR** glycopyrrolate **OR** glycopyrrolate, LORazepam **OR** LORazepam **OR** LORazepam, morphine injection, morphine CONCENTRATE **OR** morphine CONCENTRATE, ondansetron **OR** ondansetron (ZOFRAN) IV, polyvinyl alcohol  Micro Results Recent Results (from the past 240 hour(s))  SARS Coronavirus 2 by RT PCR (hospital order, performed in [facility] hospital lab) Nasopharyngeal Nasopharyngeal Swab     Status: None   Collection Time: 03/31/20  7:14 PM   Specimen: Nasopharyngeal Swab  Result Value Ref Range Status   SARS Coronavirus 2 NEGATIVE NEGATIVE Final    Comment: (NOTE) SARS-CoV-2 target nucleic acids are NOT DETECTED. The SARS-CoV-2 RNA is generally detectable in upper and lower respiratory specimens during the acute phase of infection. The lowest concentration of SARS-CoV-2 viral  copies this assay can detect is 250 copies / mL. A negative result does not preclude SARS-CoV-2 infection and should not be used as the sole basis for treatment or other patient management decisions.  A negative result may occur with improper specimen collection / handling, submission of specimen other than nasopharyngeal swab, presence of viral mutation(s) within the areas targeted by this assay, and inadequate number of viral copies (<250 copies / mL). A negative result must be combined with clinical observations, patient history, and epidemiological information. Fact Sheet for Patients:   StrictlyIdeas.no Fact Sheet for Healthcare Providers: BankingDealers.co.za This test is not yet approved or cleared  by the [place] FDA and has been authorized for detection and/or diagnosis of SARS-CoV-2 by FDA under an Emergency Use Authorization (EUA).  This EUA will remain in effect (meaning this test can be used) for the duration of the COVID-19 declaration under Section 564(b)(1) of the Act, 21 U.S.C. section 360bbb-3(b)(1), unless the authorization is terminated or revoked sooner. Performed at [facility], [place] [address] [place], [place] [address]     Radiology Reports DG Chest Portable 1 View  Result Date: 03/31/2020 CLINICAL DATA:  Shortness of breath. Hypoxia. Congestive heart failure. EXAM: PORTABLE CHEST 1 VIEW COMPARISON:  03/25/2020 FINDINGS: Stable cardiomegaly.  Aortic atherosclerosis noted. Stable scarring in both lung bases and right upper lobe. No evidence of acute infiltrate or pleural effusion. IMPRESSION: Stable cardiomegaly, and bibasilar and right upper lobe scarring. No acute findings. Electronically Signed   By: Danae Orleans M.D.   On: 03/31/2020 18:34     Huey Bienenstock M.D on 04/03/2020 at 4:04 PM  Between 7am to 7pm - Pager - [phone]  After 7pm go to [email or web address] - password [facility]  [facility]  -  Office  [phone]

## 2020-04-03 NOTE — Progress Notes (Signed)
[facility] [employer]  Re-evaluated pt and noted that she is doing some better with ability to decrease her oxygen requirements to 8 liters with some low dose morphine scheduled. She is eating well 75-100% of meals yesterday recorded. She can take her medication by mouth and can continue to takes these in an outpt setting of home with hospice or a facility if unable to be accommodated at home. Our MD Dr. Benedetto Goad does not feel she meets criteria for [facility] at this time.  I have updated the pt's son and the [place] of the decision based on our MD. Norm Parcel RN [phone]

## 2020-04-03 NOTE — Progress Notes (Signed)
Manufacturing systems engineer [facility]) Hospital Liaison note.     Received request from [facility] manager to assess patient for [facility] eligibility.   Patient is eligible for [facility] but we do not have a room to offer today.  TOC plans to discuss with family and follow up with [facility].   A Please do not hesitate to call with questions.     Thank you,     Elsie Saas, RN, [facility]       [facility] Liaison (listed on AMION under Hospice /[facility])     [phone]

## 2020-04-04 MED ORDER — ONDANSETRON 4 MG PO TBDP: 4.0000 mg | ORAL_TABLET | Freq: Four times a day (QID) | ORAL | 0 refills | Status: AC | PRN

## 2020-04-04 MED ORDER — SENNOSIDES-DOCUSATE SODIUM 8.6-50 MG PO TABS: 2.0000 | ORAL_TABLET | Freq: Two times a day (BID) | ORAL | Status: AC

## 2020-04-04 MED ORDER — LORAZEPAM 1 MG PO TABS: 1.0000 mg | ORAL_TABLET | ORAL | 0 refills | Status: AC | PRN

## 2020-04-04 MED ORDER — MORPHINE SULFATE (CONCENTRATE) 10 MG/0.5ML PO SOLN: 5.0000 mg | ORAL | Status: AC | PRN

## 2020-04-04 MED ORDER — ACETAMINOPHEN 325 MG PO TABS: 650.0000 mg | ORAL_TABLET | Freq: Four times a day (QID) | ORAL | Status: AC | PRN

## 2020-04-04 NOTE — Progress Notes (Signed)
PROGRESS NOTE                                                                                                                                                                                                             Patient Demographics:    Megan Hurst, is a 86 y.o. female, DOB - 20-Jan-1930, [id number]  Admit date - 03/31/2020   Admitting Physician Eduard Clos, MD  Outpatient Primary MD for the patient is [name], MD  LOS - 4   Chief Complaint  Patient presents with  . Shortness of Breath       Brief Narrative    ASA FATH is a 86 y.o. female with history of COPD was recently admitted at [facility] for COPD exacerbation and patient had stayed at the hospital for almost 17 days discharged home yesterday was found to be increasingly weak short of breath by patient's home health aide and was referred to the ER.  Patient denies any chest pain but has been feeling weak and more short of breath.  Denies any abdominal pain nausea vomiting productive cough fever chills, In the ER patient's EKG shows acute ST elevation MI in the inferior leads chest x-ray unremarkable on-call cardiologist Dr. Excell Seltzer was consulted.  At this time patient and patient's son has requested no aggressive measures and the plan is to keep patient comfortable.  Patient has become more progressively short of breath and hypoxic.  Labs do show troponin of 2500 with BNP of 873.  Covid test was negative WBC was 18.80 creatinine 1.1.   Subjective:    [place] Geissinger today denies any chest pain, reports dyspnea at baseline, reports that she has been nauseous today, with poor appetite, reports today is the worst day she is feeling so far.   Assessment  & Plan :    Active Problems:   Essential hypertension   COPD GOLD II    Acute ST elevation myocardial infarction (STEMI) of inferior wall (HCC)   Acute respiratory failure with hypoxia (HCC)   A-fib (HCC)   ARF  (acute renal failure) (HCC)   Palliative care by specialist   DNR (do not resuscitate)  Acute STEMI/inferior wall MI -Patient and family very clear from admission, they do not wish for any invasive measures, they wanted conservative management, with goals of mind to  proceed with comfort. -Urology input greatly appreciated, given family and patient wishes, she was treated with heparin GTT for 24 to 48 hours, currently she is off heparin drip. -Patient is currently comfort measures, no further work-up at this point.  CKD -At baseline, no intervention needed  Severe COPD -O she is oxygen dependent, appears to be end-stage, with significant oxygen requirement currently at 15 L nasal cannula.  Acute on chronic hypoxic respiratory failure -She was requiring 15 L on admission, she is currently tolerating 2 L nasal cannula .  Atrial fibrillation -He is currently comfort, no need for anticoagulation  Goals of care: Dilated consult input greatly appreciated, at this point goal is mainly for comfort, and plan for residential hospice at [facility], which has been consulted.    Patient mentions today that she has chosen [facility] in [place], and she would like [place] to to deliver this message to the hospice facility.  COVID-19 Labs  No results for input(s): DDIMER, FERRITIN, LDH, CRP in the last 72 hours.  Lab Results  Component Value Date   [place] NEGATIVE 03/31/2020     Code Status : DNR  Family Communication  : none at bedside  Disposition Plan  :  Status is: Inpatient  Remains inpatient appropriate because:IV treatments appropriate due to intensity of illness or inability to take PO   Dispo: The patient is from: Home              Anticipated d/c is to: Residential hospice?  Once bed available              Anticipated d/c date is: 1 day              Patient currently is not medically stable to d/c.         Consults  :  Cardiolgoy  Procedures  :  none  DVT Prophylaxis  :   heparin GTT stopped  Lab Results  Component Value Date   PLT 128 (L) 04/02/2020   Antibiotics  :    Anti-infectives (From admission, onward)   None        Objective:   Vitals:   04/03/20 1856 04/04/20 0450 04/04/20 0904 04/04/20 0907  BP:  (!) 116/105    Pulse:   (!) 113 (!) 128  Resp:  20 (!) 24 (!) 24  Temp:  97.7 F (36.5 C)    TempSrc:  Oral    SpO2: 93%  (!) 84% 91%  Weight:      Height:        Wt Readings from Last 3 Encounters:  04/01/20 62.6 kg  08/07/15 67 kg  07/24/15 69.5 kg     Intake/Output Summary (Last 24 hours) at 04/04/2020 1357 Last data filed at 04/03/2020 1726 Gross per 24 hour  Intake 120 ml  Output --  Net 120 ml     Physical Exam  Awake Alert, Oriented X 3, does appear to be comfortable  symmetrical Chest wall movement, increased work of breathing, diminished air entry at the bases with scattered rails RRR,No Gallops,Rubs or new Murmurs, No Parasternal Heave +ve B.Sounds, Abd Soft, No tenderness, No rebound - guarding or rigidity. No Cyanosis, lower extremity edema has improved.    Data Review:    CBC Recent Labs  Lab 03/31/20 1743 04/01/20 0313 04/02/20 0443  WBC 18.8* 14.2* 10.8*  HGB 12.3 11.1* 10.5*  HCT 38.1 34.0* 32.1*  PLT 149* 135* 128*  MCV 96.5 95.2 95.3  MCH  31.1 31.1 31.2  MCHC 32.3 32.6 32.7  RDW 14.6 14.8 14.9  LYMPHSABS 1.1  --   --   MONOABS 0.8  --   --   EOSABS 0.1  --   --   BASOSABS 0.0  --   --     Chemistries  Recent Labs  Lab 03/31/20 1743  NA 135  K 3.9  CL 101  CO2 22  GLUCOSE 124*  BUN 26*  CREATININE 1.19*  CALCIUM 9.3  AST 35  ALT 24  ALKPHOS 78  BILITOT 1.1   ------------------------------------------------------------------------------------------------------------------ No results for input(s): CHOL, HDL, LDLCALC, TRIG, CHOLHDL, LDLDIRECT in the last 72 hours.  No results found for:  HGBA1C ------------------------------------------------------------------------------------------------------------------ No results for input(s): TSH, T4TOTAL, T3FREE, THYROIDAB in the last 72 hours.  Invalid input(s): FREET3 ------------------------------------------------------------------------------------------------------------------ No results for input(s): VITAMINB12, FOLATE, FERRITIN, TIBC, IRON, RETICCTPCT in the last 72 hours.  Coagulation profile No results for input(s): INR, PROTIME in the last 168 hours.  No results for input(s): DDIMER in the last 72 hours.  Cardiac Enzymes No results for input(s): CKMB, TROPONINI, MYOGLOBIN in the last 168 hours.  Invalid input(s): CK ------------------------------------------------------------------------------------------------------------------    Component Value Date/Time   BNP 873.0 (H) 03/31/2020 1747    Inpatient Medications  Scheduled Meds: . fluticasone furoate-vilanterol  1 puff Inhalation Daily  . ipratropium-albuterol  3 mL Nebulization BID  . mirtazapine  7.5 mg Oral QHS  . morphine CONCENTRATE  5 mg Oral Q8H  . nystatin   Topical BID  . senna-docusate  1 tablet Oral BID   Continuous Infusions:  PRN Meds:.acetaminophen **OR** acetaminophen, antiseptic oral rinse, glycopyrrolate **OR** glycopyrrolate **OR** glycopyrrolate, LORazepam **OR** LORazepam **OR** LORazepam, morphine injection, morphine CONCENTRATE **OR** morphine CONCENTRATE, ondansetron **OR** ondansetron (ZOFRAN) IV, polyvinyl alcohol  Micro Results Recent Results (from the past 240 hour(s))  SARS Coronavirus 2 by RT PCR (hospital order, performed in [facility] hospital lab) Nasopharyngeal Nasopharyngeal Swab     Status: None   Collection Time: 03/31/20  7:14 PM   Specimen: Nasopharyngeal Swab  Result Value Ref Range Status   SARS Coronavirus 2 NEGATIVE NEGATIVE Final    Comment: (NOTE) SARS-CoV-2 target nucleic acids are NOT DETECTED. The  SARS-CoV-2 RNA is generally detectable in upper and lower respiratory specimens during the acute phase of infection. The lowest concentration of SARS-CoV-2 viral copies this assay can detect is 250 copies / mL. A negative result does not preclude SARS-CoV-2 infection and should not be used as the sole basis for treatment or other patient management decisions.  A negative result may occur with improper specimen collection / handling, submission of specimen other than nasopharyngeal swab, presence of viral mutation(s) within the areas targeted by this assay, and inadequate number of viral copies (<250 copies / mL). A negative result must be combined with clinical observations, patient history, and epidemiological information. Fact Sheet for Patients:   BoilerBrush.com.cy Fact Sheet for Healthcare Providers: https://pope.com/ This test is not yet approved or cleared  by the [place] FDA and has been authorized for detection and/or diagnosis of SARS-CoV-2 by FDA under an Emergency Use Authorization (EUA).  This EUA will remain in effect (meaning this test can be used) for the duration of the COVID-19 declaration under Section 564(b)(1) of the Act, 21 U.S.C. section 360bbb-3(b)(1), unless the authorization is terminated or revoked sooner. Performed at [facility], [address], [place], [place] [address]     Radiology Reports DG Chest Portable 1 View  Result  Date: 03/31/2020 CLINICAL DATA:  Shortness of breath. Hypoxia. Congestive heart failure. EXAM: PORTABLE CHEST 1 VIEW COMPARISON:  03/25/2020 FINDINGS: Stable cardiomegaly. Aortic atherosclerosis noted. Stable scarring in both lung bases and right upper lobe. No evidence of acute infiltrate or pleural effusion. IMPRESSION: Stable cardiomegaly, and bibasilar and right upper lobe scarring. No acute findings. Electronically Signed   By: Danae Orleans M.D.   On: 03/31/2020 18:34      Huey Bienenstock M.D on 04/04/2020 at 1:57 PM  Between 7am to 7pm - Pager - [phone]  After 7pm go to [email or web address] - password [facility]  [facility] -  Office  [phone]

## 2020-04-04 NOTE — Social Work (Addendum)
CSW reached out to Charleen Kirks at [phone]. CSW informed pt was accepted to [facility]. [name] stated that [facility] is preferred so she will be closer to her family. [name] stated that he will be at the hospital to visit his mom about 2pm and requested CSW to talk to him and pt together about options. CSW reached out to [place] at [facility] to see if pt is still being considered. Dennie Bible stated she will reach back out to CSW.   3:00pm Pt was accepted [facility]. Trena Platt has reached out to pt son to start paperwork.   Jimmy Picket, Theresia Majors, [place] Clinical Social Worker [phone]

## 2020-04-04 NOTE — Discharge Summary (Signed)
Megan Hurst, is a 86 y.o. female  DOB [birth year]  MRN [id number].  Admission date:  03/31/2020  Admitting Physician  Eduard Clos, MD  Discharge Date:  04/04/2020   Primary MD  [name], MD     Patient mentions today that she has chosen [facility] in [place], and reports she already has did all the arrangements with them, and she would like [place] to to deliver this message to the hospice facility.  Recommendations for primary care physician for things to follow:  -Managment per [facility]    Admission Diagnosis  Myocardial ischemia [I25.9] Respiratory distress [R06.03] Acute MI (HCC) [I21.9] Acute respiratory failure with hypoxia (HCC) [J96.01]   Discharge Diagnosis  Myocardial ischemia [I25.9] Respiratory distress [R06.03] Acute MI (HCC) [I21.9] Acute respiratory failure with hypoxia (HCC) [J96.01]    Active Problems:   Essential hypertension   COPD GOLD II    Acute ST elevation myocardial infarction (STEMI) of inferior wall (HCC)   Acute respiratory failure with hypoxia (HCC)   A-fib (HCC)   ARF (acute renal failure) (HCC)   Palliative care by specialist   DNR (do not resuscitate)      Past Medical History:  Diagnosis Date  . CHF (congestive heart failure) (HCC)   . COPD (chronic obstructive pulmonary disease) (HCC)   . Hypertension     Past Surgical History:  Procedure Laterality Date  . APPENDECTOMY    . CHOLECYSTECTOMY         History of present illness and  Hospital Course:     Kindly see H&P for history of present illness and admission details, please review complete Labs, Consult reports and Test reports for all details in brief  HPI  from the history and physical done on the day of admission 03/31/2020 HPI: Megan Hurst is a 86 y.o. female with history of COPD was recently admitted at [facility] for COPD exacerbation  and patient had stayed at the hospital for almost 17 days discharged home yesterday was found to be increasingly weak short of breath by patient's home health aide and was referred to the ER.  Patient denies any chest pain but has been feeling weak and more short of breath.  Denies any abdominal pain nausea vomiting productive cough fever chills.  ED Course: In the ER patient's EKG shows acute ST elevation MI in the inferior leads chest x-ray unremarkable on-call cardiologist Dr. Excell Seltzer was consulted.  At this time patient and patient's son has requested no aggressive measures and the plan is to keep patient comfortable.  Patient has become more progressively short of breath and hypoxic.  Labs do show troponin of 2500 with BNP of 873.  Covid test was negative WBC was 18.80 creatinine 1.1.   Hospital Course   Acute STEMI/inferior wall MI -Patient and family very clear from admission, they do not wish for any invasive measures, they wanted conservative management, with goals of mind to proceed with comfort. -Cardiology input greatly appreciated, given family  and patient wishes, she was treated with heparin GTT for 24 to 48 hours, currently she is off heparin drip. -Patient is currently comfort measures, no further work-up at this point.  CKD -At baseline, no further labs given she is hospice  Severe COPD - she is oxygen dependent, appears to be end-stage, with significant oxygen requirement currently usually at 15 L high flow nasal cannula, this has been fluctuating during hospital stay .  Acute on chronic hypoxic respiratory failure -She was requiring 15 L on admission, she is currently tolerating 2 L nasal cannula .  Atrial fibrillation -She is currently comfort, no need for anticoagulation   Goals of care: Medicine has been consulted, comfort care has been established as her goals of care, patient been accepted to [facility], plan to discharge this afternoon.  Discharge  Condition:  Comfort care       Discharge Instructions  and  Discharge Medications    Discharge Instructions    Discharge instructions   Complete by: As directed    Management of [facility]     Allergies as of 04/04/2020      Reactions   Codeine Shortness Of Breath, Palpitations   Albuterol Other (See Comments)   tremor   Bupropion Other (See Comments)   unknown   Calcitonin (salmon) Diarrhea   Penicillins Hives      Medication List    STOP taking these medications   Acidophilus/Pectin Caps   apixaban 5 MG Tabs tablet Commonly known as: ELIQUIS   aspirin 81 MG EC tablet   Breo Ellipta 100-25 MCG/INH Aepb Generic drug: fluticasone furoate-vilanterol   Calcium 600+D Plus Minerals 600-400 MG-UNIT Chew   cholecalciferol 1000 units tablet Commonly known as: VITAMIN D   diltiazem 240 MG 24 hr capsule Commonly known as: TIAZAC   LASIX PO   metoprolol tartrate 25 MG tablet Commonly known as: LOPRESSOR   mirtazapine 7.5 MG tablet Commonly known as: REMERON   Multi-Vitamin tablet   multivitamin with minerals tablet   omeprazole 20 MG capsule Commonly known as: PRILOSEC   potassium chloride SA 20 MEQ tablet Commonly known as: KLOR-CON     TAKE these medications   acetaminophen 325 MG tablet Commonly known as: TYLENOL Take 2 tablets (650 mg total) by mouth every 6 (six) hours as needed for mild pain (or Fever >/= 101).   ipratropium-albuterol 0.5-2.5 (3) MG/3ML Soln Commonly known as: DuoNeb Take 3 mLs by nebulization 4 (four) times daily.   LORazepam 1 MG tablet Commonly known as: ATIVAN Take 1 tablet (1 mg total) by mouth every 4 (four) hours as needed for anxiety.   morphine CONCENTRATE 10 MG/0.5ML Soln concentrated solution Take 0.25 mLs (5 mg total) by mouth every 2 (two) hours as needed for moderate pain (or dyspnea).   ondansetron 4 MG disintegrating tablet Commonly known as: ZOFRAN-ODT Take 1 tablet (4 mg total) by mouth every 6  (six) hours as needed for nausea.   senna-docusate 8.6-50 MG tablet Commonly known as: Senokot-S Take 2 tablets by mouth 2 (two) times daily. What changed:   how much to take  when to take this         Diet and Activity recommendation: See Discharge Instructions above   Consults obtained - Palliative Cardiolgoy   Major procedures and Radiology Reports - PLEASE review detailed and final reports for all details, in brief -     DG Chest Portable 1 View  Result Date: 03/31/2020 CLINICAL DATA:  Shortness  of breath. Hypoxia. Congestive heart failure. EXAM: PORTABLE CHEST 1 VIEW COMPARISON:  03/25/2020 FINDINGS: Stable cardiomegaly. Aortic atherosclerosis noted. Stable scarring in both lung bases and right upper lobe. No evidence of acute infiltrate or pleural effusion. IMPRESSION: Stable cardiomegaly, and bibasilar and right upper lobe scarring. No acute findings. Electronically Signed   By: Marlaine Hind M.D.   On: 03/31/2020 18:34    Micro Results    Recent Results (from the past 240 hour(s))  SARS Coronavirus 2 by RT PCR (hospital order, performed in [facility] hospital lab) Nasopharyngeal Nasopharyngeal Swab     Status: None   Collection Time: 03/31/20  7:14 PM   Specimen: Nasopharyngeal Swab  Result Value Ref Range Status   SARS Coronavirus 2 NEGATIVE NEGATIVE Final    Comment: (NOTE) SARS-CoV-2 target nucleic acids are NOT DETECTED. The SARS-CoV-2 RNA is generally detectable in upper and lower respiratory specimens during the acute phase of infection. The lowest concentration of SARS-CoV-2 viral copies this assay can detect is 250 copies / mL. A negative result does not preclude SARS-CoV-2 infection and should not be used as the sole basis for treatment or other patient management decisions.  A negative result may occur with improper specimen collection / handling, submission of specimen other than nasopharyngeal swab, presence of viral mutation(s) within the areas  targeted by this assay, and inadequate number of viral copies (<250 copies / mL). A negative result must be combined with clinical observations, patient history, and epidemiological information. Fact Sheet for Patients:   StrictlyIdeas.no Fact Sheet for Healthcare Providers: BankingDealers.co.za This test is not yet approved or cleared  by the [place] FDA and has been authorized for detection and/or diagnosis of SARS-CoV-2 by FDA under an Emergency Use Authorization (EUA).  This EUA will remain in effect (meaning this test can be used) for the duration of the COVID-19 declaration under Section 564(b)(1) of the Act, 21 U.S.C. section 360bbb-3(b)(1), unless the authorization is terminated or revoked sooner. Performed at [facility], [place] [address] [place], [place] [address]        Today   Subjective:   Jakerria Kingbird today denies any chest pain, reports dyspnea at baseline, reports that she has been nauseous today, with poor appetite, reports today is the worst day she is feeling so far.  Objective:   Blood pressure (!) 116/105, pulse (!) 128, temperature 97.7 F (36.5 C), temperature source Oral, resp. rate (!) 24, height 5\' 3"  (1.6 m), weight 62.6 kg, SpO2 91 %.   Intake/Output Summary (Last 24 hours) at 04/04/2020 1511 Last data filed at 04/03/2020 1726 Gross per 24 hour  Intake 120 ml  Output -  Net 120 ml    Exam  Awake Alert, Oriented X 3, does appear to be comfortable  symmetrical Chest wall movement, increased work of breathing, diminished air entry at the bases with scattered rails RRR,No Gallops,Rubs or new Murmurs, No Parasternal Heave +ve B.Sounds, Abd Soft, No tenderness, No rebound - guarding or rigidity. No Cyanosis, lower extremity edema has improved.  Data Review   CBC w Diff:  Lab Results  Component Value Date   WBC 10.8 (H) 04/02/2020   HGB 10.5 (L) 04/02/2020   HCT 32.1 (L) 04/02/2020    PLT 128 (L) 04/02/2020   LYMPHOPCT 6 03/31/2020   MONOPCT 4 03/31/2020   EOSPCT 0 03/31/2020   BASOPCT 0 03/31/2020    CMP:  Lab Results  Component Value Date   NA 135 03/31/2020  K 3.9 03/31/2020   CL 101 03/31/2020   CO2 22 03/31/2020   BUN 26 (H) 03/31/2020   CREATININE 1.19 (H) 03/31/2020   PROT 5.7 (L) 03/31/2020   ALBUMIN 3.0 (L) 03/31/2020   BILITOT 1.1 03/31/2020   ALKPHOS 78 03/31/2020   AST 35 03/31/2020   ALT 24 03/31/2020  .   Total Time in preparing paper work, data evaluation and todays exam - 35 minutes  Huey Bienenstock M.D on 04/04/2020 at 3:11 PM  [facility]   Office  [phone]

## 2020-04-04 NOTE — Consult Note (Signed)
Spoke with [place], SW and discussed patient with Dr. Hazel Sams.  Patient still does not qualify for GIP inpatient for [facility]  Please let us know if we can help if patient declines.

## 2020-04-04 NOTE — TOC Transition Note (Addendum)
Transition of Care [facility]) - CM/SW Discharge Note   Patient Details  Name: Megan Hurst MRN: [id number] Date of Birth: [birth year]  [facility] CM/SW Contact:  Jimmy Picket, LCSWA Phone Number: 04/04/2020, 4:01 PM   Clinical Narrative:     Pt is being discharged [facility]. [name] has been notified.   Nurse can call report to [phone].  Final next level of care: Hospice Medical Facility Barriers to Discharge: Barriers Resolved   Patient Goals and CMS Choice Patient states their goals for this hospitalization and ongoing recovery are:: TO be comfortable.      Discharge Placement              Patient chooses bed at: [facility]) Patient to be transferred to facility by: [facility] Name of family member notified: [name] at bedside Patient and family notified of of transfer: 04/04/20  Discharge Plan and Services                                     Social Determinants of Health (SDOH) Interventions     Readmission Risk Interventions No flowsheet data found.  Jimmy Picket, Theresia Majors, [place] Clinical Social Worker [phone]

## 2020-04-04 NOTE — Discharge Instructions (Signed)
Management of [facility]

## 2020-04-04 NOTE — Progress Notes (Signed)
[facility] Documentation  °   °Pt has been approved for [facility] transfer. [facility] does have a bed available for pt today. Paperwork has been completed and transportation can be arranged.   °   °Please call [facility] at [phone] to give charge nurse report and fax discharge summary to [phone].  °   °Please dc any lines. May leave catheter in place if pt has one. Please send pt to [facility] with DNR paperwork.   °   °Please call with any questions.   °   °Thank you,   °Jennifer Love, RN  ° °

## 2020-04-04 NOTE — Progress Notes (Signed)
Huey Bienenstock to be D/C'd [facility] Files per MD order. Report was given to [facility].  IV catheter discontinued intact. Site without signs and symptoms of complications. Dressing and pressure applied.  An After Visit Summary was printed and and placed in folder. Pt is currently waiting for [facility] to transport. Will transfer Pt to oncoming nurse. Dickie La  04/04/2020 7:04 PM

## 2020-04-04 NOTE — Progress Notes (Signed)
[facility] Documentation  Liaison outreached TOC to touch base re: Dentist. At this time the family prefers [facility]. Please let us know if we can assist any further in the future.   Thank you,  Trena Platt, RN  [facility] Liaison [phone]

## 2020-04-04 NOTE — Progress Notes (Signed)
Duoneb stopped due to pt's HR jumping up between 110-170 per monitor and manual radial pulse. RN notified of pt's HR.

## 2020-04-05 NOTE — Progress Notes (Signed)
Patient picked up by [facility] blood pressure low at time of leaving the floor but she is asymptomatic went with belongingsdischarge package given.

## 2020-04-07 DIAGNOSIS — R0600 Dyspnea, unspecified: Secondary | ICD-10-CM

## 2020-04-14 DEATH — deceased
